# Patient Record
Sex: Male | Born: 1935 | Race: White | Hispanic: No | Marital: Married | Smoking: Never smoker
Health system: Southern US, Community
[De-identification: ages and names within clinical notes are randomized; demographics above are authoritative.]

## PROBLEM LIST (undated history)

## (undated) DIAGNOSIS — R233 Spontaneous ecchymoses: Secondary | ICD-10-CM

## (undated) DIAGNOSIS — G309 Alzheimer's disease, unspecified: Secondary | ICD-10-CM

## (undated) DIAGNOSIS — R972 Elevated prostate specific antigen [PSA]: Secondary | ICD-10-CM

## (undated) DIAGNOSIS — K4090 Unilateral inguinal hernia, without obstruction or gangrene, not specified as recurrent: Secondary | ICD-10-CM

## (undated) DIAGNOSIS — R310 Gross hematuria: Secondary | ICD-10-CM

## (undated) DIAGNOSIS — Z87442 Personal history of urinary calculi: Secondary | ICD-10-CM

## (undated) DIAGNOSIS — K921 Melena: Secondary | ICD-10-CM

## (undated) DIAGNOSIS — K219 Gastro-esophageal reflux disease without esophagitis: Secondary | ICD-10-CM

## (undated) DIAGNOSIS — R238 Other skin changes: Secondary | ICD-10-CM

## (undated) DIAGNOSIS — R0602 Shortness of breath: Secondary | ICD-10-CM

## (undated) DIAGNOSIS — R319 Hematuria, unspecified: Secondary | ICD-10-CM

## (undated) DIAGNOSIS — R109 Unspecified abdominal pain: Secondary | ICD-10-CM

## (undated) DIAGNOSIS — N179 Acute kidney failure, unspecified: Secondary | ICD-10-CM

## (undated) DIAGNOSIS — F0280 Dementia in other diseases classified elsewhere without behavioral disturbance: Secondary | ICD-10-CM

## (undated) DIAGNOSIS — N400 Enlarged prostate without lower urinary tract symptoms: Secondary | ICD-10-CM

## (undated) DIAGNOSIS — E785 Hyperlipidemia, unspecified: Secondary | ICD-10-CM

## (undated) DIAGNOSIS — K5900 Constipation, unspecified: Secondary | ICD-10-CM

## (undated) DIAGNOSIS — Z8719 Personal history of other diseases of the digestive system: Secondary | ICD-10-CM

## (undated) HISTORY — DX: Unspecified abdominal pain: R10.9

## (undated) HISTORY — DX: Other skin changes: R23.8

## (undated) HISTORY — DX: Hyperlipidemia, unspecified: E78.5

## (undated) HISTORY — DX: Personal history of other diseases of the digestive system: Z87.19

## (undated) HISTORY — DX: Constipation, unspecified: K59.00

## (undated) HISTORY — DX: Elevated prostate specific antigen (PSA): R97.20

## (undated) HISTORY — DX: Unilateral inguinal hernia, without obstruction or gangrene, not specified as recurrent: K40.90

## (undated) HISTORY — DX: Gastro-esophageal reflux disease without esophagitis: K21.9

## (undated) HISTORY — DX: Melena: K92.1

## (undated) HISTORY — DX: Personal history of urinary calculi: Z87.442

## (undated) HISTORY — DX: Benign prostatic hyperplasia without lower urinary tract symptoms: N40.0

## (undated) HISTORY — DX: Spontaneous ecchymoses: R23.3

## (undated) HISTORY — DX: Hematuria, unspecified: R31.9

## (undated) HISTORY — DX: Gross hematuria: R31.0

---

## 1999-09-15 ENCOUNTER — Encounter: Admission: RE | Admit: 1999-09-15 | Discharge: 1999-09-15 | Payer: Self-pay | Admitting: Urology

## 1999-09-15 ENCOUNTER — Encounter: Payer: Self-pay | Admitting: Urology

## 1999-09-17 ENCOUNTER — Ambulatory Visit (HOSPITAL_BASED_OUTPATIENT_CLINIC_OR_DEPARTMENT_OTHER): Admission: RE | Admit: 1999-09-17 | Discharge: 1999-09-17 | Payer: Self-pay | Admitting: Urology

## 2000-03-19 ENCOUNTER — Other Ambulatory Visit: Admission: RE | Admit: 2000-03-19 | Discharge: 2000-03-19 | Payer: Self-pay | Admitting: Urology

## 2002-07-28 ENCOUNTER — Emergency Department (HOSPITAL_COMMUNITY): Admission: EM | Admit: 2002-07-28 | Discharge: 2002-07-29 | Payer: Self-pay

## 2003-12-28 ENCOUNTER — Ambulatory Visit (HOSPITAL_COMMUNITY): Admission: RE | Admit: 2003-12-28 | Discharge: 2003-12-28 | Payer: Self-pay | Admitting: Gastroenterology

## 2004-07-01 HISTORY — PX: CHOLECYSTECTOMY: SHX55

## 2004-07-03 ENCOUNTER — Inpatient Hospital Stay (HOSPITAL_COMMUNITY): Admission: EM | Admit: 2004-07-03 | Discharge: 2004-07-06 | Payer: Self-pay | Admitting: Emergency Medicine

## 2011-11-16 ENCOUNTER — Ambulatory Visit
Admission: RE | Admit: 2011-11-16 | Discharge: 2011-11-16 | Disposition: A | Discharge status: Home / Self Care | Payer: Medicare Other | Source: Ambulatory Visit | Attending: Family Medicine | Admitting: Family Medicine

## 2011-11-16 ENCOUNTER — Other Ambulatory Visit: Payer: Self-pay | Admitting: Family Medicine

## 2011-11-16 DIAGNOSIS — R109 Unspecified abdominal pain: Secondary | ICD-10-CM

## 2011-12-29 ENCOUNTER — Encounter (HOSPITAL_COMMUNITY): Payer: Self-pay | Admitting: Emergency Medicine

## 2011-12-29 ENCOUNTER — Emergency Department (HOSPITAL_COMMUNITY): Payer: Medicare Other

## 2011-12-29 ENCOUNTER — Emergency Department (HOSPITAL_COMMUNITY)
Admission: EM | Admit: 2011-12-29 | Discharge: 2011-12-29 | Disposition: A | Discharge status: Home / Self Care | Payer: Medicare Other | Attending: Emergency Medicine | Admitting: Emergency Medicine

## 2011-12-29 DIAGNOSIS — Z7982 Long term (current) use of aspirin: Secondary | ICD-10-CM | POA: Insufficient documentation

## 2011-12-29 DIAGNOSIS — G309 Alzheimer's disease, unspecified: Secondary | ICD-10-CM | POA: Insufficient documentation

## 2011-12-29 DIAGNOSIS — K5900 Constipation, unspecified: Secondary | ICD-10-CM | POA: Insufficient documentation

## 2011-12-29 DIAGNOSIS — F0280 Dementia in other diseases classified elsewhere without behavioral disturbance: Secondary | ICD-10-CM | POA: Insufficient documentation

## 2011-12-29 DIAGNOSIS — R109 Unspecified abdominal pain: Secondary | ICD-10-CM | POA: Insufficient documentation

## 2011-12-29 HISTORY — DX: Dementia in other diseases classified elsewhere, unspecified severity, without behavioral disturbance, psychotic disturbance, mood disturbance, and anxiety: F02.80

## 2011-12-29 HISTORY — DX: Alzheimer's disease, unspecified: G30.9

## 2011-12-29 LAB — URINALYSIS, ROUTINE W REFLEX MICROSCOPIC
Nitrite: NEGATIVE
Protein, ur: NEGATIVE mg/dL
Specific Gravity, Urine: 1.015 (ref 1.005–1.030)
Urobilinogen, UA: 1 mg/dL (ref 0.0–1.0)

## 2011-12-29 NOTE — ED Notes (Signed)
Has been being treated by regular MD for blood in urine, has appt with urologist May 16th.

## 2011-12-29 NOTE — ED Notes (Signed)
To ED via [facility] with c/o R LQ pain, had episode last night with nausea and vomiting, went away, but returned this am, stated everything went black for a few seconds, and pain radiated from RLQ towards chest. Upon arrival denies pain, alert/oriented x 3, w/d.

## 2011-12-29 NOTE — ED Provider Notes (Signed)
History     CSN: [id number]  Arrival date & time 12/29/11  1110   First MD Initiated Contact with Patient 12/29/11 1131      Chief Complaint  Patient presents with  . Abdominal Pain    (Consider location/radiation/quality/duration/timing/severity/associated sxs/prior treatment) HPI Comments: Johnathan Browning is a 78 y.o. Male who had 30 minutes of severe right sided abdominal pain today at home. The pain resolved spontaneously. His wife gave him 325 mg of aspirin at the suggestion of the 911 operator. He was transported by EMS. He has been seeing his doctor for persistent hematuria for several months. He had a CT scan done 2 months ago that did not show any acute problem. The patient has been eating well. He has had frequent stooling for several months as and occasionally vomits if he strains to have a bowel movement. There's been no recent fever, chills, cough, vomiting, dysuria, no hematuria , or ambulation,difficulty.  Patient is a 78 y.o. male presenting with abdominal pain. The history is provided by the patient.  Abdominal Pain The primary symptoms of the illness include abdominal pain.    Past Medical History  Diagnosis Date  . Alzheimer's dementia     for 10 years    Past Surgical History  Procedure Date  . Cholecystectomy     History reviewed. No pertinent family history.  History  Substance Use Topics  . Smoking status: Not on file  . Smokeless tobacco: Not on file  . Alcohol Use: No      Review of Systems  Gastrointestinal: Positive for abdominal pain.  All other systems reviewed and are negative.    Allergies  Review of patient's allergies indicates no known allergies.  Home Medications   Current Outpatient Rx  Name Route Sig Dispense Refill  . ASPIRIN 325 MG PO TABS Oral Take 325 mg by mouth daily.    . ASPIRIN EC 81 MG PO TBEC Oral Take 81 mg by mouth daily.    Marland Kitchen FINASTERIDE 5 MG PO TABS Oral Take 5 mg by mouth daily.    Marland Kitchen GALANTAMINE  HYDROBROMIDE 8 MG PO TABS Oral Take 8 mg by mouth 2 (two) times daily.    . ADULT MULTIVITAMIN W/MINERALS CH Oral Take 1 tablet by mouth daily.    Marland Kitchen SIMVASTATIN 10 MG PO TABS Oral Take 10 mg by mouth at bedtime.      BP 136/76  Pulse 64  Temp(Src) 97.8 F (36.6 C) (Oral)  Resp 16  SpO2 99%  Physical Exam  Nursing note and vitals reviewed. Constitutional: He is oriented to person, place, and time. He appears well-developed and well-nourished.       He is a somewhat poor historian, but his wife answer many questions.  HENT:  Head: Normocephalic and atraumatic.  Right Ear: External ear normal.  Left Ear: External ear normal.  Eyes: Conjunctivae and EOM are normal. Pupils are equal, round, and reactive to light.  Neck: Normal range of motion and phonation normal. Neck supple.  Cardiovascular: Normal rate, regular rhythm, normal heart sounds and intact distal pulses.   Pulmonary/Chest: Effort normal and breath sounds normal. He exhibits no bony tenderness.  Abdominal: Soft. Normal appearance. There is no tenderness.  Musculoskeletal: Normal range of motion.  Neurological: He is alert and oriented to person, place, and time. He has normal strength. No cranial nerve deficit or sensory deficit. He exhibits normal muscle tone. Coordination normal.  Skin: Skin is warm, dry and intact.  Psychiatric: He  has a normal mood and affect. His behavior is normal.    ED Course  Procedures (including critical care time)   Labs Reviewed  URINALYSIS, ROUTINE W REFLEX MICROSCOPIC  URINE CULTURE   Dg Abd Acute W/chest  12/29/2011  *RADIOLOGY REPORT*  Clinical Data: Abdominal pain, dizziness.  ACUTE ABDOMEN SERIES (ABDOMEN 2 VIEW & CHEST 1 VIEW)  Comparison: CT 11/16/2011  Findings: Moderate stool burden throughout the colon.  Prior cholecystectomy. The bowel gas pattern is normal.  There is no evidence of free intraperitoneal air.  No suspicious radio-opaque calculi or other significant radiographic  abnormality is seen. Heart size and mediastinal contours are within normal limits.  Both lungs are clear.  IMPRESSION: Moderate stool burden.  No acute findings.  Original Report Authenticated By: Cyndie Chime, M.D.     1. Abdominal pain   2. Constipation       MDM  Nonspecific abdominal pain, transient, and not recurring after. Observation in the emergency department. Vital signs are normal. Patient is calm and comfortable. Has no complaints as of 1521 hours. Doubt colitis, urinary tract infection, metabolic instability or impending vascular collapse.   Plan: Home Medications- Miralax for constipation; Home Treatments- increase fluids; Recommended follow up- PCP 1 week    Flint Melter, MD 12/29/11 [phone]

## 2011-12-29 NOTE — Discharge Instructions (Signed)
Use MiraLAX twice a day until you have a good soft, bowel movement. Then, using MiraLAX once a day for one week.  Abdominal Pain Abdominal pain can be caused by many things. Your caregiver decides the seriousness of your pain by an examination and possibly blood tests and X-rays. Many cases can be observed and treated at home. Most abdominal pain is not caused by a disease and will probably improve without treatment. However, in many cases, more time must pass before a clear cause of the pain can be found. Before that point, it may not be known if you need more testing, or if hospitalization or surgery is needed. HOME CARE INSTRUCTIONS   Do not take laxatives unless directed by your caregiver.   Take pain medicine only as directed by your caregiver.   Only take over-the-counter or prescription medicines for pain, discomfort, or fever as directed by your caregiver.   Try a clear liquid diet (broth, tea, or water) for as long as directed by your caregiver. Slowly move to a bland diet as tolerated.  SEEK IMMEDIATE MEDICAL CARE IF:   The pain does not go away.   You have a fever.   You keep throwing up (vomiting).   The pain is felt only in portions of the abdomen. Pain in the right side could possibly be appendicitis. In an adult, pain in the left lower portion of the abdomen could be colitis or diverticulitis.   You pass bloody or black tarry stools.  MAKE SURE YOU:   Understand these instructions.   Will watch your condition.   Will get help right away if you are not doing well or get worse.  Document Released: 05/27/2005 Document Revised: 08/06/2011 Document Reviewed: 04/04/2008 [facility] Patient Information 2012 [place], [place].Constipation in Adults Constipation is having fewer than 2 bowel movements per week. Usually, the stools are hard. As we grow older, constipation is more common. If you try to fix constipation with laxatives, the problem may get worse. This is because  laxatives taken over a long period of time make the colon muscles weaker. A low-fiber diet, not taking in enough fluids, and taking some medicines may make these problems worse. MEDICATIONS THAT MAY CAUSE CONSTIPATION  Water pills (diuretics).   Calcium channel blockers (used to control blood pressure and for the heart).   Certain pain medicines (narcotics).   Anticholinergics.   Anti-inflammatory agents.   Antacids that contain aluminum.  DISEASES THAT CONTRIBUTE TO CONSTIPATION  Diabetes.   Parkinson's disease.   Dementia.   Stroke.   Depression.   Illnesses that cause problems with salt and water metabolism.  HOME CARE INSTRUCTIONS   Constipation is usually best cared for without medicines. Increasing dietary fiber and eating more fruits and vegetables is the best way to manage constipation.   Slowly increase fiber intake to 25 to 38 grams per day. Whole grains, fruits, vegetables, and legumes are good sources of fiber. A dietitian can further help you incorporate high-fiber foods into your diet.   Drink enough water and fluids to keep your urine clear or pale yellow.   A fiber supplement may be added to your diet if you cannot get enough fiber from foods.   Increasing your activities also helps improve regularity.   Suppositories, as suggested by your caregiver, will also help. If you are using antacids, such as aluminum or calcium containing products, it will be helpful to switch to products containing magnesium if your caregiver says it is okay.  If you have been given a liquid injection (enema) today, this is only a temporary measure. It should not be relied on for treatment of longstanding (chronic) constipation.   Stronger measures, such as magnesium sulfate, should be avoided if possible. This may cause uncontrollable diarrhea. Using magnesium sulfate may not allow you time to make it to the bathroom.  SEEK IMMEDIATE MEDICAL CARE IF:   There is bright red  blood in the stool.   The constipation stays for more than 4 days.   There is belly (abdominal) or rectal pain.   You do not seem to be getting better.   You have any questions or concerns.  MAKE SURE YOU:   Understand these instructions.   Will watch your condition.   Will get help right away if you are not doing well or get worse.  Document Released: 05/15/2004 Document Revised: 08/06/2011 Document Reviewed: 07/21/2011 [facility] Patient Information 2012 [place], [place].

## 2011-12-29 NOTE — ED Notes (Signed)
[id number]<AV> Expected date:12/29/11<BR> Expected time:11:02 AM<BR> Means of arrival:Ambulance<BR> Comments:<BR> RLQ pain-radiating to chest.

## 2011-12-30 LAB — URINE CULTURE

## 2012-02-10 ENCOUNTER — Encounter (INDEPENDENT_AMBULATORY_CARE_PROVIDER_SITE_OTHER): Payer: Self-pay

## 2012-02-15 ENCOUNTER — Ambulatory Visit (INDEPENDENT_AMBULATORY_CARE_PROVIDER_SITE_OTHER): Payer: Medicare Other | Admitting: General Surgery

## 2012-02-15 ENCOUNTER — Encounter (INDEPENDENT_AMBULATORY_CARE_PROVIDER_SITE_OTHER): Payer: Self-pay | Admitting: General Surgery

## 2012-02-15 VITALS — BP 132/70 | HR 68 | Temp 97.4°F | Resp 14 | Ht 69.0 in | Wt 156.4 lb

## 2012-02-15 DIAGNOSIS — K4090 Unilateral inguinal hernia, without obstruction or gangrene, not specified as recurrent: Secondary | ICD-10-CM

## 2012-02-15 NOTE — Patient Instructions (Signed)
Avoid a lot of heavy lifting and straining.  Take Miralax daily.

## 2012-02-15 NOTE — Progress Notes (Signed)
Patient ID: Johnathan Browning, male   DOB: 1931/06/16, 78 y.o.   MRN: [id number]  Chief Complaint  Patient presents with  . Inguinal Hernia    HPI Johnathan Browning is a 78 y.o. male.   HPI He is referred by Dr. Margarita Grizzle for evaluation of a symptomatic right inguinal hernia. He began having some discomfort and swelling in the right inguinal area months ago. It tends to be worse when he is straining. He saw Dr. Margarita Grizzle for evaluation of gross hematuria and was noted to have a hernia. He's been referred here for further evaluation and treatment. He does have some constipation at times. He states he does not have to strain much to urinate. He is here with his wife and his daughter. He no longer drives. Past Medical History  Diagnosis Date  . Alzheimer's dementia     for 10 years  . Right inguinal hernia   . Gross hematuria   . Elevated PSA 2001; 2003    bx negative  . History of nephrolithiasis   . History of esophageal reflux   . Enlarged prostate   . Hyperlipidemia   . GERD (gastroesophageal reflux disease)   . Blood in urine   . Abdominal pain   . Constipation   . Easy bruising   . Blood in stool   . Inguinal hernia     Past Surgical History  Procedure Date  . Cholecystectomy 07/2004    Family History  Problem Relation Age of Onset  . Cancer Mother     breast  . Heart disease Mother   . Stroke Father     Social History History  Substance Use Topics  . Smoking status: Never Smoker   . Smokeless tobacco: Never Used  . Alcohol Use: No    No Known Allergies  Current Outpatient Prescriptions  Medication Sig Dispense Refill  . aspirin EC 81 MG tablet Take 81 mg by mouth daily.      . finasteride (PROSCAR) 5 MG tablet Take 5 mg by mouth daily.      Marland Kitchen galantamine (RAZADYNE) 8 MG tablet Take 8 mg by mouth 2 (two) times daily.      . Multiple Vitamin (MULITIVITAMIN WITH MINERALS) TABS Take 1 tablet by mouth daily.      . simvastatin (ZOCOR) 10 MG tablet Take 10 mg  by mouth at bedtime.        Review of Systems Review of Systems  Constitutional: Negative.   Respiratory: Negative.   Cardiovascular: Negative.   Gastrointestinal: Positive for abdominal pain and constipation. Blood in stool: right groin.  Genitourinary: Positive for difficulty urinating (occasional).  Hematological: Negative.     Blood pressure 132/70, pulse 68, temperature 97.4 F (36.3 C), temperature source Temporal, resp. rate 14, height 5\' 9"  (1.753 m), weight 156 lb 6 oz (70.931 kg).  Physical Exam Physical Exam  Constitutional: He appears well-developed and well-nourished. No distress.  HENT:  Head: Normocephalic and atraumatic.  Neck: Neck supple.  Cardiovascular: Normal rate and regular rhythm.   Pulmonary/Chest: Effort normal and breath sounds normal.  Abdominal: Soft. He exhibits no distension and no mass. There is no tenderness.       Small, reducible umbilical hernia.  Small upper abdominal scars.  Genitourinary:       Reducible right inguinal bulge.  No left inguinal bulge.  Musculoskeletal: He exhibits no edema.  Skin: Skin is warm and dry.    Data Reviewed Dr. Hilario Quarry notes.  Assessment  Symptomatic right inguinal hernia.    Plan    Right inguinal hernia repair with mesh. We will try this as an outpatient but he may be an overnight stay.  We'll have him start MiraLax daily to help with his constipation.  I have explained the procedure, risks, and aftercare of inguinal hernia repair.  Risks include but are not limited to bleeding, infection, wound problems, anesthesia, recurrence, bladder or intestine injury, urinary retention, testicular dysfunction, chronic pain, mesh problems.  They seem to understand and agree to proceed.         Laniesha Das J 02/15/2012, 2:16 PM

## 2012-03-04 ENCOUNTER — Encounter (HOSPITAL_COMMUNITY): Payer: Self-pay | Admitting: Pharmacy Technician

## 2012-03-07 ENCOUNTER — Other Ambulatory Visit (INDEPENDENT_AMBULATORY_CARE_PROVIDER_SITE_OTHER): Payer: Self-pay | Admitting: General Surgery

## 2012-03-07 NOTE — Progress Notes (Signed)
03/07/12 Need orders placed in EPIC for upcoming upcoming surgery on 03/17/12.  Preop appointment is 03/11/12 at 1300.   Thanks.

## 2012-03-11 ENCOUNTER — Encounter (HOSPITAL_COMMUNITY)
Admission: RE | Admit: 2012-03-11 | Discharge: 2012-03-11 | Disposition: A | Discharge status: Home / Self Care | Payer: Medicare Other | Source: Ambulatory Visit | Attending: General Surgery | Admitting: General Surgery

## 2012-03-11 ENCOUNTER — Encounter (HOSPITAL_COMMUNITY): Payer: Self-pay

## 2012-03-11 DIAGNOSIS — R0602 Shortness of breath: Secondary | ICD-10-CM

## 2012-03-11 HISTORY — DX: Shortness of breath: R06.02

## 2012-03-11 LAB — CBC
MCH: 32.9 pg (ref 26.0–34.0)
MCHC: 34.4 g/dL (ref 30.0–36.0)
MCV: 95.8 fL (ref 78.0–100.0)
Platelets: 146 10*3/uL — ABNORMAL LOW (ref 150–400)
RBC: 5.01 MIL/uL (ref 4.22–5.81)
RDW: 12.9 % (ref 11.5–15.5)

## 2012-03-11 LAB — COMPREHENSIVE METABOLIC PANEL
ALT: 16 U/L (ref 0–53)
AST: 23 U/L (ref 0–37)
Albumin: 3.7 g/dL (ref 3.5–5.2)
CO2: 29 mEq/L (ref 19–32)
Calcium: 9.4 mg/dL (ref 8.4–10.5)
Creatinine, Ser: 0.83 mg/dL (ref 0.50–1.35)
GFR calc non Af Amer: 82 mL/min — ABNORMAL LOW (ref >90)
Sodium: 141 mEq/L (ref 135–145)
Total Protein: 6.8 g/dL (ref 6.0–8.3)

## 2012-03-11 LAB — DIFFERENTIAL
Basophils Absolute: 0 10*3/uL (ref 0.0–0.1)
Eosinophils Relative: 3 % (ref 0–5)
Lymphocytes Relative: 22 % (ref 12–46)
Lymphs Abs: 1.6 10*3/uL (ref 0.7–4.0)
Monocytes Absolute: 0.6 10*3/uL (ref 0.1–1.0)
Neutro Abs: 4.8 10*3/uL (ref 1.7–7.7)

## 2012-03-11 LAB — SURGICAL PCR SCREEN: MRSA, PCR: NEGATIVE

## 2012-03-11 NOTE — Pre-Procedure Instructions (Addendum)
03-11-12 No EKG/CXR needed per anesthesia guidelines. 03-11-12 1640 Spoke with wife per phone. Inform positive screen for Staph aureus-will fill RX for Mupirocin Oint. And use as directed.W.Lashonne Shull,RN

## 2012-03-11 NOTE — Patient Instructions (Addendum)
20 TRAVARUS TRUDO  03/11/2012   Your procedure is scheduled on: 7-18  -2013  Report to [facility] at     1030   AM .  Call this number if you have problems the morning of surgery: [phone]   Remember:   Do not eat food:After Midnight.  May have clear liquids:up to 6 Hours before arrival. Nothing after : 0700 AM  Clear liquids include soda, tea, black coffee, apple or grape juice, broth.  Take these medicines the morning of surgery with A SIP OF WATER: Finasteride, Galantamine.   Do not wear jewelry, make-up or nail polish.  Do not wear lotions, powders, or perfumes. You may wear deodorant.  Do not shave 48 hours prior to surgery.(face and neck okay, no shaving of legs)  Do not bring valuables to the hospital.  Contacts, dentures or bridgework may not be worn into surgery.  Leave suitcase in the car. After surgery it may be brought to your room.  For patients admitted to the hospital, checkout time is 11:00 AM the day of discharge.   Patients discharged the day of surgery will not be allowed to drive home.  Name and phone number of your driver: family  Special Instructions: CHG Shower Use Special Wash: 1/2 bottle night before surgery and 1/2 bottle morning of surgery.(avoid face and genitals)   Please read over the following fact sheets that you were given: MRSA Information.

## 2012-03-17 ENCOUNTER — Encounter (HOSPITAL_COMMUNITY): Payer: Self-pay | Admitting: *Deleted

## 2012-03-17 ENCOUNTER — Encounter (HOSPITAL_COMMUNITY)
Admission: RE | Disposition: A | Discharge status: Home / Self Care | Payer: Self-pay | Source: Ambulatory Visit | Attending: General Surgery

## 2012-03-17 ENCOUNTER — Ambulatory Visit (HOSPITAL_COMMUNITY)
Admission: RE | Admit: 2012-03-17 | Discharge: 2012-03-17 | Disposition: A | Discharge status: Home / Self Care | Payer: Medicare Other | Source: Ambulatory Visit | Attending: General Surgery | Admitting: General Surgery

## 2012-03-17 ENCOUNTER — Ambulatory Visit (HOSPITAL_COMMUNITY): Payer: Medicare Other | Admitting: Anesthesiology

## 2012-03-17 ENCOUNTER — Encounter (HOSPITAL_COMMUNITY): Payer: Self-pay | Admitting: Anesthesiology

## 2012-03-17 DIAGNOSIS — Z01812 Encounter for preprocedural laboratory examination: Secondary | ICD-10-CM | POA: Insufficient documentation

## 2012-03-17 DIAGNOSIS — Z7982 Long term (current) use of aspirin: Secondary | ICD-10-CM | POA: Insufficient documentation

## 2012-03-17 DIAGNOSIS — Z79899 Other long term (current) drug therapy: Secondary | ICD-10-CM | POA: Insufficient documentation

## 2012-03-17 DIAGNOSIS — G309 Alzheimer's disease, unspecified: Secondary | ICD-10-CM | POA: Insufficient documentation

## 2012-03-17 DIAGNOSIS — K219 Gastro-esophageal reflux disease without esophagitis: Secondary | ICD-10-CM | POA: Insufficient documentation

## 2012-03-17 DIAGNOSIS — E785 Hyperlipidemia, unspecified: Secondary | ICD-10-CM | POA: Insufficient documentation

## 2012-03-17 DIAGNOSIS — F0280 Dementia in other diseases classified elsewhere without behavioral disturbance: Secondary | ICD-10-CM | POA: Insufficient documentation

## 2012-03-17 DIAGNOSIS — K4090 Unilateral inguinal hernia, without obstruction or gangrene, not specified as recurrent: Secondary | ICD-10-CM

## 2012-03-17 HISTORY — PX: HERNIA REPAIR: SHX51

## 2012-03-17 HISTORY — PX: INGUINAL HERNIA REPAIR: SHX194

## 2012-03-17 SURGERY — REPAIR, HERNIA, INGUINAL, ADULT
Anesthesia: General | Site: Groin | Laterality: Right | Wound class: Clean Contaminated

## 2012-03-17 MED ORDER — CEFAZOLIN SODIUM-DEXTROSE 2-3 GM-% IV SOLR
INTRAVENOUS | Status: AC
  Filled 2012-03-17: qty 50

## 2012-03-17 MED ORDER — KETOROLAC TROMETHAMINE 30 MG/ML IJ SOLN
15.0000 mg | Freq: Once | INTRAMUSCULAR | Status: AC | PRN
  Administered 2012-03-17: 15 mg via INTRAVENOUS

## 2012-03-17 MED ORDER — OXYCODONE HCL 5 MG PO TABS
ORAL_TABLET | ORAL | Status: AC
  Filled 2012-03-17: qty 1

## 2012-03-17 MED ORDER — FENTANYL CITRATE 0.05 MG/ML IJ SOLN: 25.0000 ug | INTRAMUSCULAR | Status: DC | PRN

## 2012-03-17 MED ORDER — OXYCODONE-ACETAMINOPHEN 5-325 MG PO TABS: 1.0000 | ORAL_TABLET | ORAL | Status: AC | PRN

## 2012-03-17 MED ORDER — EPHEDRINE SULFATE 50 MG/ML IJ SOLN
INTRAMUSCULAR | Status: DC | PRN
  Administered 2012-03-17 (×2): 5 mg via INTRAVENOUS

## 2012-03-17 MED ORDER — ACETAMINOPHEN 10 MG/ML IV SOLN
INTRAVENOUS | Status: AC
  Filled 2012-03-17: qty 100

## 2012-03-17 MED ORDER — BUPIVACAINE-EPINEPHRINE 0.5% -1:200000 IJ SOLN
INTRAMUSCULAR | Status: DC | PRN
  Administered 2012-03-17: 30 mL

## 2012-03-17 MED ORDER — ONDANSETRON HCL 4 MG/2ML IJ SOLN
INTRAMUSCULAR | Status: DC | PRN
  Administered 2012-03-17: 4 mg via INTRAVENOUS

## 2012-03-17 MED ORDER — CEFAZOLIN SODIUM-DEXTROSE 2-3 GM-% IV SOLR: 2.0000 g | INTRAVENOUS | Status: DC

## 2012-03-17 MED ORDER — KETOROLAC TROMETHAMINE 30 MG/ML IJ SOLN
INTRAMUSCULAR | Status: AC
  Filled 2012-03-17: qty 1

## 2012-03-17 MED ORDER — LACTATED RINGERS IV SOLN
INTRAVENOUS | Status: DC
  Administered 2012-03-17: 1000 mL via INTRAVENOUS
  Administered 2012-03-17: 15:00:00 via INTRAVENOUS

## 2012-03-17 MED ORDER — ACETAMINOPHEN 10 MG/ML IV SOLN
INTRAVENOUS | Status: DC | PRN
  Administered 2012-03-17: 1000 mg via INTRAVENOUS

## 2012-03-17 MED ORDER — FENTANYL CITRATE 0.05 MG/ML IJ SOLN
INTRAMUSCULAR | Status: DC | PRN
  Administered 2012-03-17: 100 ug via INTRAVENOUS
  Administered 2012-03-17 (×2): 25 ug via INTRAVENOUS

## 2012-03-17 MED ORDER — PROMETHAZINE HCL 25 MG/ML IJ SOLN: 6.2500 mg | INTRAMUSCULAR | Status: DC | PRN

## 2012-03-17 MED ORDER — BUPIVACAINE 0.5 % ON-Q PUMP SINGLE CATH 100 ML
100.0000 mL | INJECTION | Status: DC
  Administered 2012-03-17: 100 mL
  Filled 2012-03-17 (×2): qty 100

## 2012-03-17 MED ORDER — VANCOMYCIN HCL IN DEXTROSE 1-5 GM/200ML-% IV SOLN
1000.0000 mg | Freq: Once | INTRAVENOUS | Status: AC
  Administered 2012-03-17: 1000 mg via INTRAVENOUS

## 2012-03-17 MED ORDER — PROPOFOL 10 MG/ML IV BOLUS
INTRAVENOUS | Status: DC | PRN
  Administered 2012-03-17: 200 mg via INTRAVENOUS

## 2012-03-17 MED ORDER — VANCOMYCIN HCL IN DEXTROSE 1-5 GM/200ML-% IV SOLN
INTRAVENOUS | Status: AC
  Filled 2012-03-17: qty 200

## 2012-03-17 MED ORDER — OXYCODONE HCL 5 MG PO TABS
5.0000 mg | ORAL_TABLET | ORAL | Status: DC | PRN
  Administered 2012-03-17: 5 mg via ORAL

## 2012-03-17 MED ORDER — KETOROLAC TROMETHAMINE 15 MG/ML IJ SOLN
INTRAMUSCULAR | Status: AC
  Filled 2012-03-17: qty 1

## 2012-03-17 MED ORDER — LIDOCAINE HCL (CARDIAC) 20 MG/ML IV SOLN
INTRAVENOUS | Status: DC | PRN
  Administered 2012-03-17: 75 mg via INTRAVENOUS

## 2012-03-17 SURGICAL SUPPLY — 45 items
APL SKNCLS STERI-STRIP NONHPOA (GAUZE/BANDAGES/DRESSINGS) ×1
BENZOIN TINCTURE PRP APPL 2/3 (GAUZE/BANDAGES/DRESSINGS) ×2 IMPLANT
BLADE HEX COATED 2.75 (ELECTRODE) ×2 IMPLANT
BLADE SURG 15 STRL LF DISP TIS (BLADE) ×1 IMPLANT
BLADE SURG 15 STRL SS (BLADE) ×2
BLADE SURG SZ10 CARB STEEL (BLADE) ×2 IMPLANT
CANISTER SUCTION 2500CC (MISCELLANEOUS) ×2 IMPLANT
CATH KIT ON Q 2.5IN SLV (PAIN MANAGEMENT) ×1 IMPLANT
CLOTH BEACON ORANGE TIMEOUT ST (SAFETY) ×2 IMPLANT
CLSR STERI-STRIP ANTIMIC 1/2X4 (GAUZE/BANDAGES/DRESSINGS) ×1 IMPLANT
DECANTER SPIKE VIAL GLASS SM (MISCELLANEOUS) ×2 IMPLANT
DRAIN PENROSE 18X1/2 LTX STRL (DRAIN) ×1 IMPLANT
DRAPE INCISE IOBAN 66X45 STRL (DRAPES) ×2 IMPLANT
DRAPE LAPAROTOMY TRNSV 102X78 (DRAPE) ×2 IMPLANT
DRAPE UTILITY XL STRL (DRAPES) ×2 IMPLANT
DRESSING TELFA 8X3 (GAUZE/BANDAGES/DRESSINGS) IMPLANT
DRSG TEGADERM 2-3/8X2-3/4 SM (GAUZE/BANDAGES/DRESSINGS) IMPLANT
DRSG TEGADERM 4X4.75 (GAUZE/BANDAGES/DRESSINGS) ×2 IMPLANT
ELECT REM PT RETURN 9FT ADLT (ELECTROSURGICAL) ×2
ELECTRODE REM PT RTRN 9FT ADLT (ELECTROSURGICAL) ×1 IMPLANT
GLOVE ECLIPSE 8.0 STRL XLNG CF (GLOVE) ×2 IMPLANT
GLOVE INDICATOR 8.0 STRL GRN (GLOVE) ×4 IMPLANT
GOWN STRL NON-REIN LRG LVL3 (GOWN DISPOSABLE) ×2 IMPLANT
GOWN STRL REIN XL XLG (GOWN DISPOSABLE) ×5 IMPLANT
KIT BASIN OR (CUSTOM PROCEDURE TRAY) ×2 IMPLANT
MESH HERNIA 3X6 (Mesh General) ×1 IMPLANT
NDL HYPO 25X1 1.5 SAFETY (NEEDLE) ×1 IMPLANT
NEEDLE HYPO 25X1 1.5 SAFETY (NEEDLE) ×2 IMPLANT
NS IRRIG 1000ML POUR BTL (IV SOLUTION) ×2 IMPLANT
PACK BASIC VI WITH GOWN DISP (CUSTOM PROCEDURE TRAY) ×2 IMPLANT
PENCIL BUTTON HOLSTER BLD 10FT (ELECTRODE) ×2 IMPLANT
SPONGE GAUZE 4X4 12PLY (GAUZE/BANDAGES/DRESSINGS) ×2 IMPLANT
SPONGE LAP 4X18 X RAY DECT (DISPOSABLE) ×2 IMPLANT
STRIP CLOSURE SKIN 1/2X4 (GAUZE/BANDAGES/DRESSINGS) ×2 IMPLANT
SUT MNCRL AB 4-0 PS2 18 (SUTURE) ×2 IMPLANT
SUT PROLENE 2 0 CT2 30 (SUTURE) ×4 IMPLANT
SUT VIC AB 2-0 SH 18 (SUTURE) ×2 IMPLANT
SUT VIC AB 3-0 54XBRD REEL (SUTURE) ×1 IMPLANT
SUT VIC AB 3-0 BRD 54 (SUTURE) ×2
SUT VIC AB 3-0 SH 27 (SUTURE) ×4
SUT VIC AB 3-0 SH 27XBRD (SUTURE) ×2 IMPLANT
SYR BULB IRRIGATION 50ML (SYRINGE) ×2 IMPLANT
SYR CONTROL 10ML LL (SYRINGE) ×2 IMPLANT
TOWEL OR 17X26 10 PK STRL BLUE (TOWEL DISPOSABLE) ×2 IMPLANT
YANKAUER SUCT BULB TIP 10FT TU (MISCELLANEOUS) ×2 IMPLANT

## 2012-03-17 NOTE — Anesthesia Preprocedure Evaluation (Addendum)
Anesthesia Evaluation  Patient identified by MRN, date of birth, ID band Patient awake    Reviewed: Allergy & Precautions, H&P , NPO status , Patient's Chart, lab work & pertinent test results  Airway Mallampati: II TM Distance: <3 FB Neck ROM: Full    Dental No notable dental hx.    Pulmonary neg pulmonary ROS,  breath sounds clear to auscultation  Pulmonary exam normal       Cardiovascular negative cardio ROS  Rhythm:Regular Rate:Normal     Neuro/Psych dementia negative psych ROS   GI/Hepatic negative GI ROS, Neg liver ROS,   Endo/Other  negative endocrine ROS  Renal/GU negative Renal ROS  negative genitourinary   Musculoskeletal negative musculoskeletal ROS (+)   Abdominal   Peds negative pediatric ROS (+)  Hematology negative hematology ROS (+)   Anesthesia Other Findings   Reproductive/Obstetrics negative OB ROS                           Anesthesia Physical Anesthesia Plan  ASA: I  Anesthesia Plan: General   Post-op Pain Management:    Induction: Intravenous  Airway Management Planned: LMA and Oral ETT  Additional Equipment:   Intra-op Plan:   Post-operative Plan: Extubation in OR  Informed Consent: I have reviewed the patients History and Physical, chart, labs and discussed the procedure including the risks, benefits and alternatives for the proposed anesthesia with the patient or authorized representative who has indicated his/her understanding and acceptance.   Dental advisory given  Plan Discussed with: CRNA and Surgeon  Anesthesia Plan Comments:         Anesthesia Quick Evaluation

## 2012-03-17 NOTE — H&P (Signed)
Johnathan Browning is an 78 y.o. male.   Chief Complaint: Here for elective surgery HPI: He has a symptomatic right inguinal hernia and presents for repair.  Past Medical History  Diagnosis Date  . Alzheimer's dementia     for 10 years  . Right inguinal hernia   . Gross hematuria   . Elevated PSA 2001; 2003    bx negative  . History of nephrolithiasis   . History of esophageal reflux   . Enlarged prostate   . Hyperlipidemia   . GERD (gastroesophageal reflux disease)   . Blood in urine   . Abdominal pain   . Constipation   . Easy bruising   . Blood in stool   . Inguinal hernia   . Shortness of breath 03-11-12    with exertion    Past Surgical History  Procedure Date  . Cholecystectomy 07/2004    Family History  Problem Relation Age of Onset  . Cancer Mother     breast  . Heart disease Mother   . Stroke Father    Social History:  reports that he has never smoked. He has never used smokeless tobacco. He reports that he does not drink alcohol or use illicit drugs.  Allergies: No Known Allergies  Medications Prior to Admission  Medication Sig Dispense Refill  . finasteride (PROSCAR) 5 MG tablet Take 5 mg by mouth daily with breakfast.       . galantamine (RAZADYNE) 8 MG tablet Take 8 mg by mouth 2 (two) times daily.      . Multiple Vitamin (MULITIVITAMIN WITH MINERALS) TABS Take 1 tablet by mouth daily.      . polyethylene glycol (MIRALAX / GLYCOLAX) packet Take 17 g by mouth daily with breakfast.      . simvastatin (ZOCOR) 10 MG tablet Take 10 mg by mouth at bedtime.      Marland Kitchen aspirin EC 81 MG tablet Take 81 mg by mouth daily with breakfast.       . naproxen sodium (ANAPROX) 220 MG tablet Take 220 mg by mouth daily as needed. For pain        No results found for this or any previous visit (from the past 48 hour(s)). No results found.  Review of Systems  Constitutional: Negative for fever and chills.  HENT: Negative for sore throat.   Respiratory: Negative for  cough.   Gastrointestinal: Negative for nausea, vomiting and diarrhea.    Blood pressure 146/64, pulse 65, temperature 98 F (36.7 C), temperature source Oral, resp. rate 18, SpO2 100.00%. Physical Exam  Constitutional: He appears well-developed and well-nourished. No distress.  HENT:  Head: Normocephalic and atraumatic.  Cardiovascular: Normal rate and regular rhythm.   Respiratory: Effort normal and breath sounds normal.  GI: Soft. He exhibits no distension. There is no tenderness.       Very small umbilical hernia.  Genitourinary:       Reducible right inguinal bulge.  No left inguinal bulge.  Musculoskeletal: He exhibits no edema.  Lymphadenopathy:    He has no cervical adenopathy.  Skin: Skin is warm and dry.     Assessment/Plan Right Inguinal Hernia  Plan:  Right inguinal hernia repair and OnQ pump placement.  Richardo Popoff J 03/17/2012, 12:47 PM

## 2012-03-17 NOTE — Interval H&P Note (Signed)
History and Physical Interval Note:  03/17/2012 12:50 PM  Johnathan Browning  has presented today for surgery, with the diagnosis of right inguinal hernia  The various methods of treatment have been discussed with the patient and family. After consideration of risks, benefits and other options for treatment, the patient has consented to  Procedure(s) (LRB): HERNIA REPAIR INGUINAL ADULT (Right) INSERTION OF MESH (Right) as a surgical intervention .  The patient's history has been reviewed, patient examined, no change in status, stable for surgery.  I have reviewed the patients' chart and labs.  Questions were answered to the patient's satisfaction.     Ioane Bhola [facility]

## 2012-03-17 NOTE — Anesthesia Postprocedure Evaluation (Signed)
  Anesthesia Post-op Note  Patient: Johnathan Browning  Procedure(s) Performed: Procedure(s) (LRB): HERNIA REPAIR INGUINAL ADULT (Right) INSERTION OF MESH (Right)  Patient Location: PACU  Anesthesia Type: General  Level of Consciousness: awake and alert   Airway and Oxygen Therapy: Patient Spontanous Breathing  Post-op Pain: mild  Post-op Assessment: Post-op Vital signs reviewed, Patient's Cardiovascular Status Stable, Respiratory Function Stable, Patent Airway and No signs of Nausea or vomiting  Post-op Vital Signs: stable  Complications: No apparent anesthesia complications

## 2012-03-17 NOTE — Transfer of Care (Signed)
Immediate Anesthesia Transfer of Care Note  Patient: Johnathan Browning  Procedure(s) Performed: Procedure(s) (LRB): HERNIA REPAIR INGUINAL ADULT (Right) INSERTION OF MESH (Right)  Patient Location: PACU  Anesthesia Type: General  Level of Consciousness: sedated, patient cooperative and responds to stimulaton  Airway & Oxygen Therapy: Patient Spontanous Breathing and Patient connected to face mask oxgen  Post-op Assessment: Report given to PACU RN and Post -op Vital signs reviewed and stable  Post vital signs: Reviewed and stable  Complications: No apparent anesthesia complications

## 2012-03-17 NOTE — Op Note (Signed)
Preoperative diagnosis:  Right inguinal hernia.  Postoperative diagnosis:  Same  Procedure:  Right inguinal hernia repair with mesh and placement of OnQ pump  Surgeon:  Avel Peace, M.D.  Anesthesia:  General/LMA with local (Marcaine).  Indication:  This is a 78 year old male with a symptomatic right inguinal hernia that presents for repair.  The procedure, risks, and aftercare were discussed with him preoperatively.  Technique:  The patient was seen in the holding room and the right groin was marked with my initials. The patient was brought to the operating, placed supine on the operating table, and the anesthetic was administered by the anesthesiologist. The hair in the right groin area was clipped as was felt to be necessary. This area was then sterilely prepped and draped.  Local anesthetic was infiltrated in the superficial and deep tissues in the right groin.  An incision was made through the skin and subcutaneous tissue until the external oblique aponeurosis was identified.  Local anesthetic was infiltrated deep to the external oblique aponeurosis. The external oblique aponeurosis was divided through the external ring medially and back toward the anterior superior iliac spine laterally. Using blunt dissection, the shelving edge of the inguinal ligament was identified inferiorly and the internal oblique aponeurosis and muscle were identified superiorly. The ilioinguinal nerve was identified and preserved.  The spermatic cord was isolated and a posterior window was made around it. An indirect hernia sac was identified and separated from the spermatic cord using blunt dissection. The hernia sac and its contents were reduced through the indirect hernia defect.   A piece of 3" x 6" polypropylene mesh was brought into the field and anchored 1-2 cm medial to the pubic tubercle with 2-0 Prolene suture. The inferior aspect of the mesh was anchored to the shelving edge of the inguinal ligament  with running 2-0 Prolene suture to a level 1-2 cm lateral to the internal ring. A slit was cut in the mesh creating 2 tails. These were wrapped around the spermatic cord. The superior aspect of the mesh was anchored to the internal oblique aponeurosis and muscle with interrupted 2-0 Vicryl sutures. The 2 tails of the mesh were then crossed creating a new internal ring and were anchored to the shelving edge of the inguinal ligament with 2-0 Prolene suture. The tip of a hemostat could be placed through the new aperture. The lateral aspect of the mesh was then tucked deep to the external oblique aponeurosis.  The wound was inspected and hemostasis was adequate.  The OnQ pump catheter was was placed through a small incision lateral to the right groin incision.  It was positioned anterior to the mesh. The external oblique aponeurosis was then closed over the mesh and cord with running 3-0 Vicryl suture. The subcutaneous tissue was closed with running 3-0 Vicryl suture. The skin closed with a running 4-0 Monocryl subcuticular stitch.  Steri-Strips and a sterile dressing were applied to the incision.  Steri strips and a sterile dressing were applied to the OnQ catheter as well and it was hooked up to the reservoir.  The procedure was well-tolerated without any apparent complications and the patient was taken to the recovery room in satisfactory condition.

## 2012-03-18 ENCOUNTER — Encounter (HOSPITAL_COMMUNITY): Payer: Self-pay | Admitting: General Surgery

## 2012-03-21 ENCOUNTER — Encounter (HOSPITAL_COMMUNITY): Admission: RE | Payer: Self-pay | Source: Ambulatory Visit

## 2012-03-21 ENCOUNTER — Ambulatory Visit (HOSPITAL_COMMUNITY): Admission: RE | Admit: 2012-03-21 | Payer: Medicare Other | Source: Ambulatory Visit | Admitting: General Surgery

## 2012-03-21 SURGERY — REPAIR, HERNIA, INGUINAL, ADULT
Anesthesia: Choice | Laterality: Right

## 2012-04-06 ENCOUNTER — Encounter (INDEPENDENT_AMBULATORY_CARE_PROVIDER_SITE_OTHER): Payer: Self-pay | Admitting: General Surgery

## 2012-04-06 ENCOUNTER — Ambulatory Visit (INDEPENDENT_AMBULATORY_CARE_PROVIDER_SITE_OTHER): Payer: Medicare Other | Admitting: General Surgery

## 2012-04-06 VITALS — BP 132/7 | HR 84 | Temp 97.8°F | Ht 68.0 in | Wt 154.8 lb

## 2012-04-06 DIAGNOSIS — Z9889 Other specified postprocedural states: Secondary | ICD-10-CM

## 2012-04-06 NOTE — Progress Notes (Signed)
Operation:  Right inguinal hernia repair with mesh  Date:  March 21, 2012  HPI:  He is here for his first postoperative visit and is doing well.   Physical Exam: Right groin incision is clean and intact with minimal swelling. Hernia repair is solid.  Assessment:  Doing well after open right inguinal hernia repair  Plan:  Continue light activities for 4 more weeks and activities as tolerated and we discussed what that meant. Return visit p.r.n.

## 2012-04-06 NOTE — Patient Instructions (Signed)
Continue light activities.  May resume normal activities as tolerated after Labor Day.

## 2012-08-08 ENCOUNTER — Other Ambulatory Visit: Payer: Self-pay | Admitting: Neurology

## 2012-08-08 DIAGNOSIS — F0280 Dementia in other diseases classified elsewhere without behavioral disturbance: Secondary | ICD-10-CM

## 2012-08-08 DIAGNOSIS — N401 Enlarged prostate with lower urinary tract symptoms: Secondary | ICD-10-CM

## 2012-08-08 DIAGNOSIS — E785 Hyperlipidemia, unspecified: Secondary | ICD-10-CM

## 2012-08-08 DIAGNOSIS — N138 Other obstructive and reflux uropathy: Secondary | ICD-10-CM

## 2012-08-10 ENCOUNTER — Ambulatory Visit
Admission: RE | Admit: 2012-08-10 | Discharge: 2012-08-10 | Disposition: A | Discharge status: Home / Self Care | Payer: Medicare Other | Source: Ambulatory Visit | Attending: Neurology | Admitting: Neurology

## 2012-08-10 DIAGNOSIS — E785 Hyperlipidemia, unspecified: Secondary | ICD-10-CM

## 2012-08-10 DIAGNOSIS — F0280 Dementia in other diseases classified elsewhere without behavioral disturbance: Secondary | ICD-10-CM

## 2012-08-10 DIAGNOSIS — N138 Other obstructive and reflux uropathy: Secondary | ICD-10-CM

## 2012-08-10 DIAGNOSIS — N401 Enlarged prostate with lower urinary tract symptoms: Secondary | ICD-10-CM

## 2012-11-11 ENCOUNTER — Other Ambulatory Visit: Payer: Self-pay | Admitting: Cardiology

## 2012-11-21 ENCOUNTER — Other Ambulatory Visit: Payer: Self-pay | Admitting: Cardiology

## 2012-11-21 NOTE — H&P (Signed)
Office Visit     Patient: Johnathan Browning, Johnathan Browning Account Number: [id number] Provider: Altie Savard, MD  DOB: 11/30/1930 Age: 79 Y Sex: Male Date: 11/11/2012  Phone: [phone]   Address: [address], [place], [place]  Pcp: DAVID [name]          1. TT/Stress test f/u.        HPI:  General:  The patient presents today for followup of SOB and chest tightness. The tightness is exertional. He says that he gets SOB with the tightness but denies any nausea or diaphoresis. The chest discomfort occurs when he uses his stationary bike as well as when he takes and bath and gets dressed in the am. He denies any LE edema, palpitations or syncope. Since having the stress test he has had 3 episodes of chest pain. He had one episode this am while sitting eating breakfast. He says he was SOB but did not have any pain but says it felt tight. This lasted about 5 minutes and resolved. .        ROS:  See HPI, A twelve system review was perfomed at today's visit. For pertinent positives and negatives see HPI.       Medical History: Hypercholesterolemia, Pneumovax given after age 67 already per wife's report, H/o kidney stone around 1993.        Surgical History: lap choly , vasectomy , endoscopy , hernia surgery 02/2012.        Family History: Father: MI age 73, CVA age 65Mother: MI age 73, breast cancer       Social History:  General:  History of smoking cigarettes: Never smoked.  no Smoking.  no Alcohol.  no Recreational drug use.  Exercise: walking once in a while.  Occupation: retired.  Marital Status: married, Peggy.  Children: Eros Jr "Daniel"; Melissa.  Religion: [employer].        Medications: Taking Aspirin 81 MG Tablet Delayed Release 1 tablet Once a day, Taking MiraLax Powder one capful p.o. q.Browning. p.r.n. constipation one as needed, Taking Simvastatin 10 MG Tablet 1 tablet every evening Once a day, Taking Finasteride 5 MG Tablet 1 tablet Once a day, Taking  Galantamine Hydrobromide 8 MG Tablet 1 tablet Twice a day, Taking Anusol-HC 25 MG Suppository 1 suppository Hasnt started yet, Medication List reviewed and reconciled with the patient       Allergies: N.K.Browning.A.          Vitals: Wt 158.4, Wt change 2 lb, Ht 67, BMI 24.81, Pulse sitting 60, BP sitting 110/80.       Examination:  Cardiology, General:  GENERAL APPEARANCE: pleasant, NAD.  HEENT: unremarkable.  CAROTID UPSTROKE: normal, no bruit.  JVD: flat.  HEART SOUNDS: regular, normal S1, S2, no S3 or S4.  MURMUR: absent.  LUNGS: no rales or wheezes.  ABDOMEN: soft, non tender, positive bowel sounds, no masses felt.  EXTREMITIES: no leg edema.  PERIPHERAL PULSES: 2 plus bilateral.            Assessment:  1. Chest pain - 786.50 (Primary)  2. SOB (shortness of breath) - 786.05        1. Chest pain  LAB: PT (Prothrombin Time) (005199)    Prothrombin Time 10.6  9.1-12.0 - SEC   INR 1.0  0.8-1.2 -    LAB: Basic Metabolic    GLUCOSE 109 H 70-99 - mg/dL   BUN 15  6-26 - mg/dL   CREATININE 0.87  0.60-1.30 - mg/dl   eGFR (  NON-AFRICAN AMERICAN) 85  >60 - calc   eGFR (AFRICAN AMERICAN) 102  >60 - calc   SODIUM 141  136-145 - mmol/L   POTASSIUM 4.0  3.5-5.5 - mmol/L   CHLORIDE 108 H 98-107 - mmol/L   C02 31  22-32 - mmol/L   ANION GAP 5.6 LL 6.0-20.0 - mmol/L   CALCIUM 9.5  8.6-10.3 - mg/dL   LAB: CBC with Diff    WBC 6.8  4.0-11.0 - K/ul   RBC 4.81  4.20-5.80 - M/uL   HGB 15.7  13.0-17.0 - g/dL   HCT 46.9  39.0-52.0 - %   MCH 32.6  27.0-33.0 - pg   MPV 8.6  7.5-10.7 - fL   MCV 97.4 H 80.0-94.0 - fL   MCHC 33.4  32.0-36.0 - g/dL   RDW 12.8  11.5-15.5 - %   NRBC# 0.01  -    PLT 130 L 150-400 - K/uL   NEUT % 59.9  43.3-71.9 - %   NRBC% 0.10  - %   LYMPH% 24.9  16.8-43.5 - %   MONO % 11.0  4.6-12.4 - %   EOS % 3.5  0.0-7.8 - %   BASO % 0.7  0.0-1.0 - %   NEUT # 4.1  1.9-7.2 - K/uL   LYMPH# 1.70  1.10-2.70 - K/uL   MONO # 0.8  0.3-0.8 - K/uL   EOS # 0.2  0.0-0.6 -  K/uL   BASO # 0.1  0.0-0.1 - K/uL    Imaging: Cardiac Cath (Ordered for 11/14/2012) Notes: He continues to have CP and SOB despite a normal nuclear stress test. I have therefore recommended that we proceed with cardiac catheterization to evaluate his coronary anatomy. I have told him to continue his ASA. If he has chest pain over the weekend that does not resolve within 5-10 minutes with rest he needs to call 911., Risks and benefits of cardiac catheterization have been reviewed including risk of stroke, heart attack, death, bleeding, renal impariment and arterial damage. There was ample oppurtuny to answer questions. Alternatives were discussed. Patient understands and wishes to proceed.        Procedures:  Venipuncture:  Venipuncture: Gasanova,Svetlana 11/11/2012 11:36:54 AM > , performed in left arm.           Procedure Codes: 80048 ECL BMP, 85025 ECL CBC PLATELET DIFF, 36415 BLOOD COLLECTION ROUTINE VENIPUNCTURE       Follow Up: cath        Care Plan Details:   Provider: Blue Winther, MD  Patient: Christman, Vaiden Browning DOB: 03/28/1931 Date: 11/11/2012       

## 2012-11-22 ENCOUNTER — Inpatient Hospital Stay (HOSPITAL_BASED_OUTPATIENT_CLINIC_OR_DEPARTMENT_OTHER)
Admission: RE | Admit: 2012-11-22 | Discharge: 2012-11-22 | Disposition: A | Discharge status: Home / Self Care | Payer: Medicare Other | Source: Ambulatory Visit | Attending: Cardiology | Admitting: Cardiology

## 2012-11-22 ENCOUNTER — Encounter (HOSPITAL_BASED_OUTPATIENT_CLINIC_OR_DEPARTMENT_OTHER)
Admission: RE | Disposition: A | Discharge status: Home / Self Care | Payer: Self-pay | Source: Ambulatory Visit | Attending: Cardiology

## 2012-11-22 DIAGNOSIS — R0602 Shortness of breath: Secondary | ICD-10-CM | POA: Insufficient documentation

## 2012-11-22 DIAGNOSIS — R0789 Other chest pain: Secondary | ICD-10-CM | POA: Insufficient documentation

## 2012-11-22 DIAGNOSIS — I519 Heart disease, unspecified: Secondary | ICD-10-CM | POA: Insufficient documentation

## 2012-11-22 DIAGNOSIS — E7800 Pure hypercholesterolemia, unspecified: Secondary | ICD-10-CM | POA: Insufficient documentation

## 2012-11-22 DIAGNOSIS — R079 Chest pain, unspecified: Secondary | ICD-10-CM

## 2012-11-22 SURGERY — JV LEFT HEART CATHETERIZATION WITH CORONARY ANGIOGRAM
Anesthesia: Moderate Sedation

## 2012-11-22 MED ORDER — ASPIRIN 81 MG PO CHEW
324.0000 mg | CHEWABLE_TABLET | ORAL | Status: AC
  Administered 2012-11-22: 324 mg via ORAL

## 2012-11-22 MED ORDER — SODIUM CHLORIDE 0.9 % IJ SOLN: 3.0000 mL | INTRAMUSCULAR | Status: DC | PRN

## 2012-11-22 MED ORDER — ACETAMINOPHEN 325 MG PO TABS: 650.0000 mg | ORAL_TABLET | ORAL | Status: DC | PRN

## 2012-11-22 MED ORDER — SODIUM CHLORIDE 0.9 % IJ SOLN: 3.0000 mL | Freq: Two times a day (BID) | INTRAMUSCULAR | Status: DC

## 2012-11-22 MED ORDER — SODIUM CHLORIDE 0.9 % IV SOLN: 1.0000 mL/kg/h | INTRAVENOUS | Status: DC

## 2012-11-22 MED ORDER — DIAZEPAM 5 MG PO TABS
5.0000 mg | ORAL_TABLET | ORAL | Status: AC
  Administered 2012-11-22: 5 mg via ORAL

## 2012-11-22 MED ORDER — ONDANSETRON HCL 4 MG/2ML IJ SOLN: 4.0000 mg | Freq: Four times a day (QID) | INTRAMUSCULAR | Status: DC | PRN

## 2012-11-22 MED ORDER — SODIUM CHLORIDE 0.9 % IV SOLN: 250.0000 mL | INTRAVENOUS | Status: DC | PRN

## 2012-11-22 MED ORDER — SODIUM CHLORIDE 0.9 % IV SOLN: INTRAVENOUS | Status: DC

## 2012-11-22 NOTE — OR Nursing (Signed)
Dr Turner at bedside to discuss results and treatment plan with pt and family 

## 2012-11-22 NOTE — H&P (View-Only) (Signed)
Office Visit     Patient: Johnathan Browning, Johnathan Browning Account Number: [id number] Provider: Armanda Magic, MD  DOB: 12/01/1930 Age: 79 Y Sex: Male Date: 11/11/2012  Phone: [phone]   Address: [address] [place], [place]  Pcp: DAVID [name]          1. TT/Stress test f/u.        HPI:  General:  The patient presents today for followup of SOB and chest tightness. The tightness is exertional. He says that he gets SOB with the tightness but denies any nausea or diaphoresis. The chest discomfort occurs when he uses his stationary bike as well as when he takes and bath and gets dressed in the am. He denies any LE edema, palpitations or syncope. Since having the stress test he has had 3 episodes of chest pain. He had one episode this am while sitting eating breakfast. He says he was SOB but did not have any pain but says it felt tight. This lasted about 5 minutes and resolved. .        ROS:  See HPI, A twelve system review was perfomed at today's visit. For pertinent positives and negatives see HPI.       Medical History: Hypercholesterolemia, Pneumovax given after age 28 already per wife's report, H/o kidney stone around 69.        Surgical History: lap choly , vasectomy , endoscopy , hernia surgery 02/2012.        Family History: Father: MI age 56, CVA age : MI age 45, breast cancer       Social History:  General:  History of smoking cigarettes: Never smoked.  no Smoking.  no Alcohol.  no Recreational drug use.  Exercise: walking once in a while.  Occupation: retired.  Marital Status: married, Clinical cytogeneticist.  Children: Chidubem Chaires "Reuel Boom"; Melissa.  Religion: [employer].        Medications: Taking Aspirin 81 MG Tablet Delayed Release 1 tablet Once a day, Taking MiraLax Powder one capful p.o. q.d. p.r.n. constipation one as needed, Taking Simvastatin 10 MG Tablet 1 tablet every evening Once a day, Taking Finasteride 5 MG Tablet 1 tablet Once a day, Taking  Galantamine Hydrobromide 8 MG Tablet 1 tablet Twice a day, Taking Anusol-HC 25 MG Suppository 1 suppository Hasnt started yet, Medication List reviewed and reconciled with the patient       Allergies: N.K.D.A.          Vitals: Wt 158.4, Wt change 2 lb, Ht 67, BMI 24.81, Pulse sitting 60, BP sitting 110/80.       Examination:  Cardiology, General:  GENERAL APPEARANCE: pleasant, NAD.  HEENT: unremarkable.  CAROTID UPSTROKE: normal, no bruit.  JVD: flat.  HEART SOUNDS: regular, normal S1, S2, no S3 or S4.  MURMUR: absent.  LUNGS: no rales or wheezes.  ABDOMEN: soft, non tender, positive bowel sounds, no masses felt.  EXTREMITIES: no leg edema.  PERIPHERAL PULSES: 2 plus bilateral.            Assessment:  1. Chest pain - 786.50 (Primary)  2. SOB (shortness of breath) - 786.05        1. Chest pain  LAB: PT (Prothrombin Time) (562130)    Prothrombin Time 10.6  9.1-12.0 - SEC   INR 1.0  0.8-1.2 -    LAB: Basic Metabolic    GLUCOSE 109 H 70-99 - mg/dL   BUN 15  8-65 - mg/dL   CREATININE 7.84  6.96-2.95 - mg/dl   eGFR (  NON-AFRICAN AMERICAN) 85  >60 - calc   eGFR (AFRICAN AMERICAN) 102  >60 - calc   SODIUM 141  136-145 - mmol/L   POTASSIUM 4.0  3.5-5.5 - mmol/L   CHLORIDE 108 H 98-107 - mmol/L   C02 31  22-32 - mmol/L   ANION GAP 5.6 LL 6.0-20.0 - mmol/L   CALCIUM 9.5  8.6-10.3 - mg/dL   LAB: CBC with Diff    WBC 6.8  4.0-11.0 - K/ul   RBC 4.81  4.20-5.80 - M/uL   HGB 15.7  13.0-17.0 - g/dL   HCT 78.2  95.6-21.3 - %   MCH 32.6  27.0-33.0 - pg   MPV 8.6  7.5-10.7 - fL   MCV 97.4 H 80.0-94.0 - fL   MCHC 33.4  32.0-36.0 - g/dL   RDW 08.6  57.8-46.9 - %   NRBC# 0.01  -    PLT 130 L 150-400 - K/uL   NEUT % 59.9  43.3-71.9 - %   NRBC% 0.10  - %   LYMPH% 24.9  16.8-43.5 - %   MONO % 11.0  4.6-12.4 - %   EOS % 3.5  0.0-7.8 - %   BASO % 0.7  0.0-1.0 - %   NEUT # 4.1  1.9-7.2 - K/uL   LYMPH# 1.70  1.10-2.70 - K/uL   MONO # 0.8  0.3-0.8 - K/uL   EOS # 0.2  0.0-0.6 -  K/uL   BASO # 0.1  0.0-0.1 - K/uL    Imaging: Cardiac Cath (Ordered for 11/14/2012) Notes: He continues to have CP and SOB despite a normal nuclear stress test. I have therefore recommended that we proceed with cardiac catheterization to evaluate his coronary anatomy. I have told him to continue his ASA. If he has chest pain over the weekend that does not resolve within 5-10 minutes with rest he needs to call 911., Risks and benefits of cardiac catheterization have been reviewed including risk of stroke, heart attack, death, bleeding, renal impariment and arterial damage. There was ample oppurtuny to answer questions. Alternatives were discussed. Patient understands and wishes to proceed.        Procedures:  Venipuncture:  Venipuncture: Gasanova,Svetlana 11/11/2012 11:36:54 AM > , performed in left arm.           Procedure Codes: 62952 ECL BMP, 85025 ECL CBC PLATELET DIFF, 84132 BLOOD COLLECTION ROUTINE VENIPUNCTURE       Follow Up: cath        Care Plan Details:   Provider: Armanda Magic, MD  Patient: Johnathan Browning, Johnathan Browning DOB: [birth year] Date: 11/11/2012

## 2012-11-22 NOTE — OR Nursing (Signed)
Meal served 

## 2012-11-22 NOTE — OR Nursing (Signed)
Discharge instructions reviewed and signed, patient's wife is caretaker and stated understanding, ambulated in hall without difficulty, site level 0, transported to friend's car via wheelchair

## 2012-11-22 NOTE — Interval H&P Note (Signed)
History and Physical Interval Note:  11/22/2012 9:02 AM  Johnathan Browning  has presented today for surgery, with the diagnosis of abn ST/cp/sob  The various methods of treatment have been discussed with the patient and family. After consideration of risks, benefits and other options for treatment, the patient has consented to  Procedure(s): JV LEFT HEART CATHETERIZATION WITH CORONARY ANGIOGRAM (N/A) as a surgical intervention .  The patient's history has been reviewed, patient examined, no change in status, stable for surgery.  I have reviewed the patient's chart and labs.  Questions were answered to the patient's satisfaction.     TURNER,TRACI R

## 2012-11-22 NOTE — OR Nursing (Signed)
Tegaderm dressing applied, site level 0, bedrest begins at 0945 

## 2012-11-22 NOTE — CV Procedure (Signed)
PROCEDURE:  Left heart catheterization with selective coronary angiography, left ventriculogram.  INDICATIONS:  Chest pain  The risks, benefits, and details of the procedure were explained to the patient.  The patient verbalized understanding and wanted to proceed.  Informed written consent was obtained.  PROCEDURE TECHNIQUE:  After Xylocaine anesthesia a 68F sheath was placed in the right femoral artery with a single anterior needle wall stick.   Left coronary angiography was done using a Judkins L4 guide catheter.  Right coronary angiography was done using a Judkins R4 guide catheter.  Left ventriculography was done using a pigtail catheter.    CONTRAST:  Total of 70 cc.  COMPLICATIONS:  None.    HEMODYNAMICS:  Aortic pressure was 135/43mmHg; LV pressure was 136/66mmHg; LVEDP .  There was no gradient between the left ventricle and aorta.    ANGIOGRAPHIC DATA:   The left main coronary artery is widely patent and bifurcates into an LAD and left circumflex arteries.    The left anterior descending artery is widely patent.  It gives rise to a first diagonal which is widely patent and then a second diagonal which is large and patent.  The ongoing LAD is widely patent throughout its course distally.  The left circumflex artery is very large and dominant.  It gives rise to a moderate sized first OM and then a very large second OM both of which are widely patent.  The ongoing left circ is patent and gives rise to a PDA which is widely patent.    The right coronary artery is small and nondominant and is patent.  LEFT VENTRICULOGRAM:  Left ventricular angiogram was done in the 30 RAO projection and revealed normal left ventricular wall motion and systolic function with an estimated ejection fraction of 60%.  LVEDP was 18 mmHg.  IMPRESSIONS:  1. Normal left main coronary artery. 2. Normal left anterior descending artery and its branches. 3. Normal left circumflex artery and its  branches. 4. Normal right coronary artery. 5. Normal left ventricular systolic function.  LVEDP 18 mmHg.  Ejection fraction 60%. 6.  Diastolic dysfunction with elevated LVEDP  RECOMMENDATION:   1.  D/C home once bedrest complete. 2.  Continue home meds 3.  Followup with primary MD for workup of noncardiac chest pain

## 2013-01-10 ENCOUNTER — Other Ambulatory Visit: Payer: Self-pay | Admitting: Family Medicine

## 2013-01-10 ENCOUNTER — Ambulatory Visit
Admission: RE | Admit: 2013-01-10 | Discharge: 2013-01-10 | Disposition: A | Discharge status: Home / Self Care | Payer: Medicare Other | Source: Ambulatory Visit | Attending: Family Medicine | Admitting: Family Medicine

## 2013-01-10 DIAGNOSIS — R109 Unspecified abdominal pain: Secondary | ICD-10-CM

## 2014-01-19 ENCOUNTER — Ambulatory Visit: Payer: Self-pay

## 2014-05-24 ENCOUNTER — Other Ambulatory Visit: Payer: Self-pay | Admitting: Family Medicine

## 2014-05-24 ENCOUNTER — Ambulatory Visit
Admission: RE | Admit: 2014-05-24 | Discharge: 2014-05-24 | Disposition: A | Discharge status: Home / Self Care | Payer: Medicare HMO | Source: Ambulatory Visit | Attending: Family Medicine | Admitting: Family Medicine

## 2014-05-24 DIAGNOSIS — R059 Cough, unspecified: Secondary | ICD-10-CM

## 2014-05-24 DIAGNOSIS — R05 Cough: Secondary | ICD-10-CM

## 2014-06-19 ENCOUNTER — Emergency Department (HOSPITAL_COMMUNITY)
Admission: EM | Admit: 2014-06-19 | Discharge: 2014-06-19 | Disposition: A | Discharge status: Home / Self Care | Payer: Medicare HMO | Attending: Emergency Medicine | Admitting: Emergency Medicine

## 2014-06-19 ENCOUNTER — Encounter (HOSPITAL_COMMUNITY): Payer: Self-pay | Admitting: Emergency Medicine

## 2014-06-19 ENCOUNTER — Emergency Department (HOSPITAL_COMMUNITY): Payer: Medicare HMO

## 2014-06-19 DIAGNOSIS — Z79899 Other long term (current) drug therapy: Secondary | ICD-10-CM | POA: Diagnosis not present

## 2014-06-19 DIAGNOSIS — Z87442 Personal history of urinary calculi: Secondary | ICD-10-CM | POA: Insufficient documentation

## 2014-06-19 DIAGNOSIS — Z7982 Long term (current) use of aspirin: Secondary | ICD-10-CM | POA: Insufficient documentation

## 2014-06-19 DIAGNOSIS — E785 Hyperlipidemia, unspecified: Secondary | ICD-10-CM | POA: Diagnosis not present

## 2014-06-19 DIAGNOSIS — G309 Alzheimer's disease, unspecified: Secondary | ICD-10-CM | POA: Diagnosis not present

## 2014-06-19 DIAGNOSIS — F0280 Dementia in other diseases classified elsewhere without behavioral disturbance: Secondary | ICD-10-CM | POA: Insufficient documentation

## 2014-06-19 DIAGNOSIS — Z87448 Personal history of other diseases of urinary system: Secondary | ICD-10-CM | POA: Insufficient documentation

## 2014-06-19 DIAGNOSIS — R0789 Other chest pain: Secondary | ICD-10-CM

## 2014-06-19 DIAGNOSIS — W19XXXA Unspecified fall, initial encounter: Secondary | ICD-10-CM

## 2014-06-19 DIAGNOSIS — S299XXA Unspecified injury of thorax, initial encounter: Secondary | ICD-10-CM | POA: Insufficient documentation

## 2014-06-19 LAB — BASIC METABOLIC PANEL
Anion gap: 10 (ref 5–15)
BUN: 13 mg/dL (ref 6–23)
CHLORIDE: 105 meq/L (ref 96–112)
CO2: 25 meq/L (ref 19–32)
CREATININE: 0.8 mg/dL (ref 0.50–1.35)
Calcium: 8.7 mg/dL (ref 8.4–10.5)
GFR calc non Af Amer: 82 mL/min — ABNORMAL LOW (ref >90)
Glucose, Bld: 123 mg/dL — ABNORMAL HIGH (ref 70–99)
POTASSIUM: 4 meq/L (ref 3.7–5.3)
Sodium: 140 mEq/L (ref 137–147)

## 2014-06-19 LAB — TROPONIN I: Troponin I: <0.3 ng/mL (ref <0.30)

## 2014-06-19 LAB — URINALYSIS, ROUTINE W REFLEX MICROSCOPIC
Bilirubin Urine: NEGATIVE
GLUCOSE, UA: 250 mg/dL — AB
Ketones, ur: NEGATIVE mg/dL
LEUKOCYTES UA: NEGATIVE
Nitrite: NEGATIVE
PROTEIN: NEGATIVE mg/dL
SPECIFIC GRAVITY, URINE: 1.015 (ref 1.005–1.030)
UROBILINOGEN UA: 1 mg/dL (ref 0.0–1.0)
pH: 6 (ref 5.0–8.0)

## 2014-06-19 LAB — CBC WITH DIFFERENTIAL/PLATELET
Basophils Absolute: 0 10*3/uL (ref 0.0–0.1)
Basophils Relative: 0 % (ref 0–1)
Eosinophils Absolute: 0.1 10*3/uL (ref 0.0–0.7)
Eosinophils Relative: 2 % (ref 0–5)
HEMATOCRIT: 43 % (ref 39.0–52.0)
HEMOGLOBIN: 14.7 g/dL (ref 13.0–17.0)
LYMPHS ABS: 1.5 10*3/uL (ref 0.7–4.0)
LYMPHS PCT: 23 % (ref 12–46)
MCH: 33 pg (ref 26.0–34.0)
MCHC: 34.2 g/dL (ref 30.0–36.0)
MCV: 96.4 fL (ref 78.0–100.0)
MONO ABS: 0.6 10*3/uL (ref 0.1–1.0)
MONOS PCT: 9 % (ref 3–12)
NEUTROS ABS: 4.3 10*3/uL (ref 1.7–7.7)
NEUTROS PCT: 66 % (ref 43–77)
Platelets: 121 10*3/uL — ABNORMAL LOW (ref 150–400)
RBC: 4.46 MIL/uL (ref 4.22–5.81)
RDW: 13.4 % (ref 11.5–15.5)
WBC: 6.5 10*3/uL (ref 4.0–10.5)

## 2014-06-19 LAB — URINE MICROSCOPIC-ADD ON

## 2014-06-19 MED ORDER — ACETAMINOPHEN 500 MG PO TABS: 500.0000 mg | ORAL_TABLET | Freq: Four times a day (QID) | ORAL | Status: DC | PRN

## 2014-06-19 MED ORDER — ACETAMINOPHEN 325 MG PO TABS
650.0000 mg | ORAL_TABLET | Freq: Once | ORAL | Status: AC
  Administered 2014-06-19: 650 mg via ORAL
  Filled 2014-06-19: qty 2

## 2014-06-19 NOTE — Discharge Instructions (Signed)
You were seen today following a fall and for chest wall pain. Pain is likely secondary to fall. See return precautions below.  Chest Pain (Nonspecific) It is often hard to give a diagnosis for the cause of chest pain. There is always a chance that your pain could be related to something serious, such as a heart attack or a blood clot in the lungs. You need to follow up with your doctor. HOME CARE  If antibiotic medicine was given, take it as directed by your doctor. Finish the medicine even if you start to feel better.  For the next few days, avoid activities that bring on chest pain. Continue physical activities as told by your doctor.  Do not use any tobacco products. This includes cigarettes, chewing tobacco, and e-cigarettes.  Avoid drinking alcohol.  Only take medicine as told by your doctor.  Follow your doctor's suggestions for more testing if your chest pain does not go away.  Keep all doctor visits you made. GET HELP IF:  Your chest pain does not go away, even after treatment.  You have a rash with blisters on your chest.  You have a fever. GET HELP RIGHT AWAY IF:   You have more pain or pain that spreads to your arm, neck, jaw, back, or belly (abdomen).  You have shortness of breath.  You cough more than usual or cough up blood.  You have very bad back or belly pain.  You feel sick to your stomach (nauseous) or throw up (vomit).  You have very bad weakness.  You pass out (faint).  You have chills. This is an emergency. Do not wait to see if the problems will go away. Call your local emergency services (911 in [place] Do not drive yourself to the hospital. MAKE SURE YOU:   Understand these instructions.  Will watch your condition.  Will get help right away if you are not doing well or get worse. Document Released: 02/03/2008 Document Revised: 08/22/2013 Document Reviewed: 02/03/2008 [facility] Patient Information 2015 [employer], [place]. This information is not  intended to replace advice given to you by your health care provider. Make sure you discuss any questions you have with your health care provider.

## 2014-06-19 NOTE — ED Provider Notes (Signed)
CSN: [id number]     Arrival date & time 06/19/14  [id number] History   First MD Initiated Contact with Patient 06/19/14 1005     Chief Complaint  Patient presents with  . Chest Pain    fall     (Consider location/radiation/quality/duration/timing/severity/associated sxs/prior Treatment) HPI  This is an 80 year old male with history of Alzheimer's dementia who [facility] from [facility].  Per EMS report, he fell yesterday. He was complaining of chest pain this morning. He is not on any anticoagulants based on chart review. History of dementia and only oriented to himself. He denies any pain at this time. Details of fall unknown. No known history of coronary artery disease. Patient's wife is at the bedside.  Level V caveat for dementia  Past Medical History  Diagnosis Date  . Alzheimer's dementia     for 10 years  . Right inguinal hernia   . Gross hematuria   . Elevated PSA 2001; 2003    bx negative  . History of nephrolithiasis   . History of esophageal reflux   . Enlarged prostate   . Hyperlipidemia   . GERD (gastroesophageal reflux disease)   . Blood in urine   . Abdominal pain   . Constipation   . Easy bruising   . Blood in stool   . Inguinal hernia   . Shortness of breath 03-11-12    with exertion   Past Surgical History  Procedure Laterality Date  . Cholecystectomy  07/2004  . Inguinal hernia repair  03/17/2012    Procedure: HERNIA REPAIR INGUINAL ADULT;  Surgeon: Adolph Pollackodd J Rosenbower, MD;  Location: [facility];  Service: General;  Laterality: Right;  . Hernia repair  03/17/12    RIH   Family History  Problem Relation Age of Onset  . Cancer Mother     breast  . Heart disease Mother   . Stroke Father    History  Substance Use Topics  . Smoking status: Never Smoker   . Smokeless tobacco: Never Used  . Alcohol Use: No    Review of Systems  Unable to perform ROS: Dementia      Allergies  Review of patient's allergies indicates no known allergies.  Home  Medications   Prior to Admission medications   Medication Sig Start Date End Date Taking? Authorizing Provider  aspirin EC 81 MG tablet Take 81 mg by mouth daily with breakfast.    Yes Historical Provider, MD  bimatoprost (LUMIGAN) 0.01 % SOLN Place 1 drop into both eyes at bedtime.   Yes Historical Provider, MD  finasteride (PROSCAR) 5 MG tablet Take 5 mg by mouth daily with breakfast.    Yes Historical Provider, MD  acetaminophen (TYLENOL) 500 MG tablet Take 1 tablet (500 mg total) by mouth every 6 (six) hours as needed. 06/19/14   Shon Batonourtney F Horton, MD   BP 132/70  Pulse 66  Temp(Src) 97.8 F (36.6 C) (Oral)  Resp 18  SpO2 100% Physical Exam  Nursing note and vitals reviewed. Constitutional: No distress.  Elderly  HENT:  Head: Normocephalic and atraumatic.  Eyes: EOM are normal. Pupils are equal, round, and reactive to light.  Neck: Neck supple.  Cardiovascular: Normal rate, regular rhythm and normal heart sounds.   No murmur heard. Pulmonary/Chest: Effort normal and breath sounds normal. No respiratory distress. He has no wheezes. He exhibits tenderness.  Left lower chest wall tenderness without crepitus  Abdominal: Soft. Bowel sounds are normal. There is no tenderness. There is no rebound.  Musculoskeletal: He exhibits no edema.  Neurological: He is alert.  Oriented only to self  Skin: Skin is warm and dry.  Psychiatric: He has a normal mood and affect.    ED Course  Procedures (including critical care time) Labs Review Labs Reviewed  CBC WITH DIFFERENTIAL - Abnormal; Notable for the following:    Platelets 121 (*)    All other components within normal limits  BASIC METABOLIC PANEL - Abnormal; Notable for the following:    Glucose, Bld 123 (*)    GFR calc non Af Amer 82 (*)    All other components within normal limits  URINALYSIS, ROUTINE W REFLEX MICROSCOPIC - Abnormal; Notable for the following:    Glucose, UA 250 (*)    Hgb urine dipstick TRACE (*)    All  other components within normal limits  TROPONIN I  URINE MICROSCOPIC-ADD ON    Imaging Review Dg Chest 2 View  06/19/2014   CLINICAL DATA:  Chest pain which started yesterday.  EXAM: CHEST  2 VIEW  COMPARISON:  05/24/2014  FINDINGS: The heart size and mediastinal contours are within normal limits. Low lung volumes with vascular crowding and streaky atelectasis. Both lungs are otherwise clear. The visualized skeletal structures are unremarkable.  IMPRESSION: Low lung volumes with mild vascular crowding and streaky atelectasis but no infiltrates, edema or effusions.   Electronically Signed   By: Loralie ChampagneMark  Gallerani M.D.   On: 06/19/2014 11:02     EKG Interpretation   Date/Time:  Tuesday June 19 2014 09:51:48 EDT Ventricular Rate:  73 PR Interval:  154 QRS Duration: 92 QT Interval:  390 QTC Calculation: 430 R Axis:   42 Text Interpretation:  Sinus rhythm Baseline wander in lead(s) V2 Confirmed  by HORTON  MD, COURTNEY ([id number]) on 06/19/2014 10:06:27 AM      MDM   Final diagnoses:  Fall, initial encounter  Chest wall pain    Patient presents with CP.  Reported fall yesterday at nursing facility.  At baseline per wife.  TTP over the left lower chest wall without crepitus.  Patient has no complaints at this time.  EKG reassuring and w/u including urinalysis and troponin neg.  Discussed with wife and son in law, treatment with tylenol for pain (to avoid polypharmacy).  They stated understanding.  After history, exam, and medical workup I feel the patient has been appropriately medically screened and is safe for discharge home. Pertinent diagnoses were discussed with the patient. Patient was given return precautions.     Shon Batonourtney F Horton, MD 06/19/14 [phone]

## 2014-06-19 NOTE — ED Notes (Signed)
Per Ems pt from nursing Facility ( [facility]) fell yesterday. Pt co of chest wall pain tha tstarted this AM. Pt denies anticoagulants. Hx of dementia. Pt alert and oriented to person, place and situation.

## 2014-06-19 NOTE — ED Notes (Signed)
Bed: ZO10WA22 Expected date:  Expected time:  Means of arrival:  Comments: ems- 80 yo

## 2014-06-22 ENCOUNTER — Emergency Department (HOSPITAL_COMMUNITY): Payer: Medicare HMO

## 2014-06-22 ENCOUNTER — Encounter (HOSPITAL_COMMUNITY): Payer: Self-pay | Admitting: Emergency Medicine

## 2014-06-22 ENCOUNTER — Inpatient Hospital Stay (HOSPITAL_COMMUNITY)
Admission: EM | Admit: 2014-06-22 | Discharge: 2014-07-05 | DRG: 699 | Disposition: A | Discharge status: Nursing Home (Includes ICF) | Payer: Medicare HMO | Attending: Internal Medicine | Admitting: Internal Medicine

## 2014-06-22 DIAGNOSIS — N179 Acute kidney failure, unspecified: Secondary | ICD-10-CM | POA: Diagnosis not present

## 2014-06-22 DIAGNOSIS — R310 Gross hematuria: Secondary | ICD-10-CM | POA: Diagnosis present

## 2014-06-22 DIAGNOSIS — R0789 Other chest pain: Secondary | ICD-10-CM

## 2014-06-22 DIAGNOSIS — F0281 Dementia in other diseases classified elsewhere with behavioral disturbance: Secondary | ICD-10-CM | POA: Diagnosis present

## 2014-06-22 DIAGNOSIS — N320 Bladder-neck obstruction: Principal | ICD-10-CM

## 2014-06-22 DIAGNOSIS — R4689 Other symptoms and signs involving appearance and behavior: Secondary | ICD-10-CM

## 2014-06-22 DIAGNOSIS — Z87442 Personal history of urinary calculi: Secondary | ICD-10-CM

## 2014-06-22 DIAGNOSIS — F02818 Dementia in other diseases classified elsewhere, unspecified severity, with other behavioral disturbance: Secondary | ICD-10-CM

## 2014-06-22 DIAGNOSIS — W19XXXA Unspecified fall, initial encounter: Secondary | ICD-10-CM

## 2014-06-22 DIAGNOSIS — G309 Alzheimer's disease, unspecified: Secondary | ICD-10-CM | POA: Diagnosis present

## 2014-06-22 DIAGNOSIS — Z7982 Long term (current) use of aspirin: Secondary | ICD-10-CM

## 2014-06-22 DIAGNOSIS — F0391 Unspecified dementia with behavioral disturbance: Secondary | ICD-10-CM

## 2014-06-22 DIAGNOSIS — Z9049 Acquired absence of other specified parts of digestive tract: Secondary | ICD-10-CM | POA: Diagnosis present

## 2014-06-22 DIAGNOSIS — Z66 Do not resuscitate: Secondary | ICD-10-CM | POA: Diagnosis present

## 2014-06-22 DIAGNOSIS — E785 Hyperlipidemia, unspecified: Secondary | ICD-10-CM | POA: Diagnosis present

## 2014-06-22 DIAGNOSIS — N139 Obstructive and reflux uropathy, unspecified: Secondary | ICD-10-CM

## 2014-06-22 DIAGNOSIS — N401 Enlarged prostate with lower urinary tract symptoms: Secondary | ICD-10-CM | POA: Diagnosis present

## 2014-06-22 DIAGNOSIS — D696 Thrombocytopenia, unspecified: Secondary | ICD-10-CM | POA: Diagnosis present

## 2014-06-22 DIAGNOSIS — R451 Restlessness and agitation: Secondary | ICD-10-CM | POA: Diagnosis present

## 2014-06-22 DIAGNOSIS — R109 Unspecified abdominal pain: Secondary | ICD-10-CM | POA: Diagnosis present

## 2014-06-22 DIAGNOSIS — N138 Other obstructive and reflux uropathy: Secondary | ICD-10-CM | POA: Diagnosis present

## 2014-06-22 DIAGNOSIS — E860 Dehydration: Secondary | ICD-10-CM | POA: Diagnosis present

## 2014-06-22 DIAGNOSIS — F03918 Unspecified dementia, unspecified severity, with other behavioral disturbance: Secondary | ICD-10-CM

## 2014-06-22 DIAGNOSIS — E440 Moderate protein-calorie malnutrition: Secondary | ICD-10-CM | POA: Diagnosis present

## 2014-06-22 DIAGNOSIS — F0390 Unspecified dementia without behavioral disturbance: Secondary | ICD-10-CM | POA: Diagnosis present

## 2014-06-22 DIAGNOSIS — R339 Retention of urine, unspecified: Secondary | ICD-10-CM | POA: Diagnosis present

## 2014-06-22 DIAGNOSIS — K219 Gastro-esophageal reflux disease without esophagitis: Secondary | ICD-10-CM | POA: Diagnosis present

## 2014-06-22 DIAGNOSIS — Z515 Encounter for palliative care: Secondary | ICD-10-CM | POA: Insufficient documentation

## 2014-06-22 DIAGNOSIS — F05 Delirium due to known physiological condition: Secondary | ICD-10-CM | POA: Diagnosis present

## 2014-06-22 DIAGNOSIS — R319 Hematuria, unspecified: Secondary | ICD-10-CM

## 2014-06-22 DIAGNOSIS — N19 Unspecified kidney failure: Secondary | ICD-10-CM | POA: Diagnosis present

## 2014-06-22 HISTORY — DX: Acute kidney failure, unspecified: N17.9

## 2014-06-22 LAB — COMPREHENSIVE METABOLIC PANEL
ALBUMIN: 3.7 g/dL (ref 3.5–5.2)
ALK PHOS: 66 U/L (ref 39–117)
ALT: 14 U/L (ref 0–53)
ANION GAP: 10 (ref 5–15)
AST: 17 U/L (ref 0–37)
BILIRUBIN TOTAL: 0.9 mg/dL (ref 0.3–1.2)
BUN: 14 mg/dL (ref 6–23)
CHLORIDE: 102 meq/L (ref 96–112)
CO2: 28 mEq/L (ref 19–32)
Calcium: 9 mg/dL (ref 8.4–10.5)
Creatinine, Ser: 0.76 mg/dL (ref 0.50–1.35)
GFR calc Af Amer: >90 mL/min (ref >90)
GFR calc non Af Amer: 83 mL/min — ABNORMAL LOW (ref >90)
Glucose, Bld: 118 mg/dL — ABNORMAL HIGH (ref 70–99)
POTASSIUM: 3.8 meq/L (ref 3.7–5.3)
SODIUM: 140 meq/L (ref 137–147)
TOTAL PROTEIN: 6.8 g/dL (ref 6.0–8.3)

## 2014-06-22 LAB — CBC WITH DIFFERENTIAL/PLATELET
BASOS ABS: 0 10*3/uL (ref 0.0–0.1)
BASOS PCT: 0 % (ref 0–1)
EOS ABS: 0.1 10*3/uL (ref 0.0–0.7)
Eosinophils Relative: 3 % (ref 0–5)
HCT: 43.9 % (ref 39.0–52.0)
HEMOGLOBIN: 15.3 g/dL (ref 13.0–17.0)
Lymphocytes Relative: 30 % (ref 12–46)
Lymphs Abs: 1.7 10*3/uL (ref 0.7–4.0)
MCH: 33.8 pg (ref 26.0–34.0)
MCHC: 34.9 g/dL (ref 30.0–36.0)
MCV: 97.1 fL (ref 78.0–100.0)
MONOS PCT: 10 % (ref 3–12)
Monocytes Absolute: 0.6 10*3/uL (ref 0.1–1.0)
NEUTROS ABS: 3.3 10*3/uL (ref 1.7–7.7)
NEUTROS PCT: 57 % (ref 43–77)
PLATELETS: 121 10*3/uL — AB (ref 150–400)
RBC: 4.52 MIL/uL (ref 4.22–5.81)
RDW: 13.5 % (ref 11.5–15.5)
WBC: 5.6 10*3/uL (ref 4.0–10.5)

## 2014-06-22 LAB — URINE MICROSCOPIC-ADD ON

## 2014-06-22 LAB — RAPID URINE DRUG SCREEN, HOSP PERFORMED
AMPHETAMINES: NOT DETECTED
BARBITURATES: NOT DETECTED
BENZODIAZEPINES: NOT DETECTED
COCAINE: NOT DETECTED
Opiates: NOT DETECTED
Tetrahydrocannabinol: NOT DETECTED

## 2014-06-22 LAB — URINALYSIS, ROUTINE W REFLEX MICROSCOPIC
BILIRUBIN URINE: NEGATIVE
GLUCOSE, UA: NEGATIVE mg/dL
KETONES UR: NEGATIVE mg/dL
Leukocytes, UA: NEGATIVE
NITRITE: NEGATIVE
PH: 6 (ref 5.0–8.0)
Protein, ur: NEGATIVE mg/dL
SPECIFIC GRAVITY, URINE: 1.021 (ref 1.005–1.030)
Urobilinogen, UA: 1 mg/dL (ref 0.0–1.0)

## 2014-06-22 LAB — ETHANOL: Alcohol, Ethyl (B): <11 mg/dL (ref 0–11)

## 2014-06-22 MED ORDER — ONDANSETRON HCL 4 MG PO TABS
4.0000 mg | ORAL_TABLET | Freq: Three times a day (TID) | ORAL | Status: DC | PRN
  Administered 2014-06-22: 4 mg via ORAL
  Filled 2014-06-22: qty 1

## 2014-06-22 MED ORDER — QUETIAPINE FUMARATE 25 MG PO TABS
25.0000 mg | ORAL_TABLET | Freq: Once | ORAL | Status: AC
  Administered 2014-06-22: 25 mg via ORAL
  Filled 2014-06-22: qty 1

## 2014-06-22 MED ORDER — ACETAMINOPHEN 325 MG PO TABS
650.0000 mg | ORAL_TABLET | ORAL | Status: DC | PRN
  Administered 2014-06-23 – 2014-06-25 (×3): 650 mg via ORAL
  Filled 2014-06-22 (×3): qty 2

## 2014-06-22 MED ORDER — ASPIRIN EC 81 MG PO TBEC
81.0000 mg | DELAYED_RELEASE_TABLET | Freq: Every day | ORAL | Status: DC
  Administered 2014-06-24 – 2014-06-29 (×4): 81 mg via ORAL
  Filled 2014-06-22 (×10): qty 1

## 2014-06-22 MED ORDER — ZOLPIDEM TARTRATE 10 MG PO TABS
10.0000 mg | ORAL_TABLET | Freq: Every evening | ORAL | Status: DC | PRN
  Administered 2014-06-22: 10 mg via ORAL
  Filled 2014-06-22: qty 1

## 2014-06-22 MED ORDER — IBUPROFEN 200 MG PO TABS: 600.0000 mg | ORAL_TABLET | Freq: Three times a day (TID) | ORAL | Status: DC | PRN

## 2014-06-22 MED ORDER — NICOTINE 21 MG/24HR TD PT24: 21.0000 mg | MEDICATED_PATCH | Freq: Every day | TRANSDERMAL | Status: DC

## 2014-06-22 MED ORDER — LATANOPROST 0.005 % OP SOLN
1.0000 [drp] | Freq: Every day | OPHTHALMIC | Status: DC
  Administered 2014-06-22 – 2014-07-04 (×12): 1 [drp] via OPHTHALMIC
  Filled 2014-06-22 (×2): qty 2.5

## 2014-06-22 MED ORDER — FINASTERIDE 5 MG PO TABS
5.0000 mg | ORAL_TABLET | Freq: Every day | ORAL | Status: DC
  Administered 2014-06-24 – 2014-07-05 (×10): 5 mg via ORAL
  Filled 2014-06-22 (×18): qty 1

## 2014-06-22 MED ORDER — ALUM & MAG HYDROXIDE-SIMETH 200-200-20 MG/5ML PO SUSP
30.0000 mL | ORAL | Status: DC | PRN
  Administered 2014-06-23 (×2): 30 mL via ORAL
  Filled 2014-06-22 (×2): qty 30

## 2014-06-22 MED ORDER — LORAZEPAM 1 MG PO TABS
1.0000 mg | ORAL_TABLET | Freq: Three times a day (TID) | ORAL | Status: DC | PRN
  Administered 2014-06-23 – 2014-06-25 (×7): 1 mg via ORAL
  Filled 2014-06-22 (×8): qty 1

## 2014-06-22 NOTE — ED Provider Notes (Addendum)
CSN: [id number]     Arrival date & time 06/22/14  1330 History   First MD Initiated Contact with Patient 06/22/14 1456     Chief Complaint  Patient presents with  . Aggressive Behavior   Level V caveat for dementia  (Consider location/radiation/quality/duration/timing/severity/associated sxs/prior Treatment) HPI Wife states patient reportedly told his facility that he had fallen on 1020. The fall was not witnessed. He was brought to the ED for complaints of pain in his chest. He had a normal chest x-ray done. Wife reports since that time he gets intermittent episodes of pain that lasts for a couple minutes at which point patient gets very angry and aggressive. Although she does admit he had anger issues before and had to be placed in a memory units about 6 months ago she feels like his anger issues now are related to the pain that he apparently has. She states sometimes it seems to hurt when he breathes. He has had a normal appetite. She states the pain seems to last a minute. He has not had nausea or vomiting or fever. He has only been getting Tylenol for pain.   PCP Dr Azucena CecilSwayne  Past Medical History  Diagnosis Date  . Alzheimer's dementia     for 10 years  . Right inguinal hernia   . Gross hematuria   . Elevated PSA 2001; 2003    bx negative  . History of nephrolithiasis   . History of esophageal reflux   . Enlarged prostate   . Hyperlipidemia   . GERD (gastroesophageal reflux disease)   . Blood in urine   . Abdominal pain   . Constipation   . Easy bruising   . Blood in stool   . Inguinal hernia   . Shortness of breath 03-11-12    with exertion   Past Surgical History  Procedure Laterality Date  . Cholecystectomy  07/2004  . Inguinal hernia repair  03/17/2012    Procedure: HERNIA REPAIR INGUINAL ADULT;  Surgeon: Adolph Pollackodd J Rosenbower, MD;  Location: [facility];  Service: General;  Laterality: Right;  . Hernia repair  03/17/12    RIH   Family History  Problem Relation Age of Onset   . Cancer Mother     breast  . Heart disease Mother   . Stroke Father    History  Substance Use Topics  . Smoking status: Never Smoker   . Smokeless tobacco: Never Used  . Alcohol Use: No  lives in an alzheimer's unit  Review of Systems  All other systems reviewed and are negative.     Allergies  Review of patient's allergies indicates no known allergies.  Home Medications   Prior to Admission medications   Medication Sig Start Date End Date Taking? Authorizing Provider  acetaminophen (TYLENOL) 500 MG tablet Take 500 mg by mouth every 6 (six) hours as needed for mild pain.   Yes Historical Provider, MD  aspirin EC 81 MG tablet Take 81 mg by mouth daily with breakfast.    Yes Historical Provider, MD  bimatoprost (LUMIGAN) 0.01 % SOLN Place 1 drop into both eyes at bedtime.   Yes Historical Provider, MD  finasteride (PROSCAR) 5 MG tablet Take 5 mg by mouth daily with breakfast.    Yes Historical Provider, MD   BP 123/53  Pulse 79  Temp(Src) 97.5 F (36.4 C) (Oral)  Resp 16  SpO2 96%  Vital signs normal   Physical Exam  Nursing note and vitals reviewed. Constitutional: He appears well-developed  and well-nourished.  Non-toxic appearance. He does not appear ill. No distress.  HENT:  Head: Normocephalic and atraumatic.  Right Ear: External ear normal.  Left Ear: External ear normal.  Nose: Nose normal. No mucosal edema or rhinorrhea.  Mouth/Throat: Oropharynx is clear and moist and mucous membranes are normal. No dental abscesses or uvula swelling.  Eyes: Conjunctivae and EOM are normal. Pupils are equal, round, and reactive to light.  Neck: Normal range of motion and full passive range of motion without pain. Neck supple.  Cardiovascular: Normal rate, regular rhythm and normal heart sounds.  Exam reveals no gallop and no friction rub.   No murmur heard. Pulmonary/Chest: Effort normal and breath sounds normal. No respiratory distress. He has no wheezes. He has no  rhonchi. He has no rales. He exhibits no tenderness and no crepitus.  He points to his left lower rib cage as to where his pain is located. There's no bruising or swelling seen. She states her was a knot in the area earlier in the week. His abdomen soft and nontender.  Abdominal: Soft. Normal appearance and bowel sounds are normal. He exhibits no distension. There is no tenderness. There is no rebound and no guarding.  Musculoskeletal: Normal range of motion. He exhibits no edema and no tenderness.  Moves all extremities well.   Neurological: He is alert. He has normal strength. No cranial nerve deficit.  Patient is confused.  Skin: Skin is warm, dry and intact. No rash noted. No erythema. No pallor.  Psychiatric: He has a normal mood and affect. His speech is normal and behavior is normal. His mood appears not anxious.    ED Course  Procedures (including critical care time)  Medications  LORazepam (ATIVAN) tablet 1 mg (not administered)  acetaminophen (TYLENOL) tablet 650 mg (not administered)  ibuprofen (ADVIL,MOTRIN) tablet 600 mg (not administered)  zolpidem (AMBIEN) tablet 10 mg (not administered)  nicotine (NICODERM CQ - dosed in mg/24 hours) patch 21 mg (21 mg Transdermal Not Given 06/22/14 1851)  ondansetron (ZOFRAN) tablet 4 mg (not administered)  alum & mag hydroxide-simeth (MAALOX/MYLANTA) 200-200-20 MG/5ML suspension 30 mL (not administered)  aspirin EC tablet 81 mg (not administered)  latanoprost (XALATAN) 0.005 % ophthalmic solution 1 drop (not administered)  finasteride (PROSCAR) tablet 5 mg (not administered)   18:30 Wife now states his aggression is getting worse despite telling me before that it was related to his pain. He will now need a psychiatric evaluation. She states Dr Azucena Cecil won't see him any more and he is never at the [facility] when their physician is there so he is between primary care doctors. He has not been given any medications for his behavioral problems. Psych  labs and tests ordered.   20:03 Pt waiting for TSS evaluation.   21:45 Marcus, TSS has evaluated patient and talked to the wife. He has talked to the psych NP and she recommended a one time dose of Seroquel tonight until he can be evaluated by the psychiatrist in the morning .  Labs Review Results for orders placed during the hospital encounter of 06/22/14  CBC WITH DIFFERENTIAL      Result Value Ref Range   WBC 5.6  4.0 - 10.5 K/uL   RBC 4.52  4.22 - 5.81 MIL/uL   Hemoglobin 15.3  13.0 - 17.0 g/dL   HCT 40.9  81.1 - 91.4 %   MCV 97.1  78.0 - 100.0 fL   MCH 33.8  26.0 - 34.0 pg   MCHC  34.9  30.0 - 36.0 g/dL   RDW 82.913.5  56.211.5 - 13.015.5 %   Platelets 121 (*) 150 - 400 K/uL   Neutrophils Relative % 57  43 - 77 %   Neutro Abs 3.3  1.7 - 7.7 K/uL   Lymphocytes Relative 30  12 - 46 %   Lymphs Abs 1.7  0.7 - 4.0 K/uL   Monocytes Relative 10  3 - 12 %   Monocytes Absolute 0.6  0.1 - 1.0 K/uL   Eosinophils Relative 3  0 - 5 %   Eosinophils Absolute 0.1  0.0 - 0.7 K/uL   Basophils Relative 0  0 - 1 %   Basophils Absolute 0.0  0.0 - 0.1 K/uL  COMPREHENSIVE METABOLIC PANEL      Result Value Ref Range   Sodium 140  137 - 147 mEq/L   Potassium 3.8  3.7 - 5.3 mEq/L   Chloride 102  96 - 112 mEq/L   CO2 28  19 - 32 mEq/L   Glucose, Bld 118 (*) 70 - 99 mg/dL   BUN 14  6 - 23 mg/dL   Creatinine, Ser 8.650.76  0.50 - 1.35 mg/dL   Calcium 9.0  8.4 - 78.410.5 mg/dL   Total Protein 6.8  6.0 - 8.3 g/dL   Albumin 3.7  3.5 - 5.2 g/dL   AST 17  0 - 37 U/L   ALT 14  0 - 53 U/L   Alkaline Phosphatase 66  39 - 117 U/L   Total Bilirubin 0.9  0.3 - 1.2 mg/dL   GFR calc non Af Amer 83 (*) >90 mL/min   GFR calc Af Amer >90  >90 mL/min   Anion gap 10  5 - 15  URINALYSIS, ROUTINE W REFLEX MICROSCOPIC      Result Value Ref Range   Color, Urine YELLOW  YELLOW   APPearance CLEAR  CLEAR   Specific Gravity, Urine 1.021  1.005 - 1.030   pH 6.0  5.0 - 8.0   Glucose, UA NEGATIVE  NEGATIVE mg/dL   Hgb urine dipstick  TRACE (*) NEGATIVE   Bilirubin Urine NEGATIVE  NEGATIVE   Ketones, ur NEGATIVE  NEGATIVE mg/dL   Protein, ur NEGATIVE  NEGATIVE mg/dL   Urobilinogen, UA 1.0  0.0 - 1.0 mg/dL   Nitrite NEGATIVE  NEGATIVE   Leukocytes, UA NEGATIVE  NEGATIVE  URINE RAPID DRUG SCREEN (HOSP PERFORMED)      Result Value Ref Range   Opiates NONE DETECTED  NONE DETECTED   Cocaine NONE DETECTED  NONE DETECTED   Benzodiazepines NONE DETECTED  NONE DETECTED   Amphetamines NONE DETECTED  NONE DETECTED   Tetrahydrocannabinol NONE DETECTED  NONE DETECTED   Barbiturates NONE DETECTED  NONE DETECTED  ETHANOL      Result Value Ref Range   Alcohol, Ethyl (B) <11  0 - 11 mg/dL  URINE MICROSCOPIC-ADD ON      Result Value Ref Range   WBC, UA 0-2  <3 WBC/hpf   RBC / HPF 3-6  <3 RBC/hpf   Urine-Other MUCOUS PRESENT      Laboratory interpretation all normal    Imaging Review Dg Ribs Unilateral W/chest Left  06/22/2014   CLINICAL DATA:  Status post fall at nursing facility yesterday. The patient complained of chest pain that started this morning.  EXAM: LEFT RIBS AND CHEST - 3+ VIEW  COMPARISON:  June 19, 2014  FINDINGS: No fracture or other bone lesions are seen involving the ribs.  There is minimal chronic deformity of the lateral left seventh rib unchanged compared to prior exam. There is no evidence of pneumothorax or pleural effusion. There is no focal infiltrate, pulmonary edema, or pleural effusion. There is minimal chronic blunting of the left costophrenic angle. Heart size and mediastinal contours are within normal limits. Surgical clips are identified in the right upper quadrant unchanged.  IMPRESSION: No acute fracture or dislocation. No acute cardiopulmonary disease identified.   Electronically Signed   By: Sherian Rein M.D.   On: 06/22/2014 17:29    Dg Chest 2 View  06/19/2014   CLINICAL DATA:  Chest pain which started yesterday.  Marland Kitchen  IMPRESSION: Low lung volumes with mild vascular crowding and streaky  atelectasis but no infiltrates, edema or effusions.   Electronically Signed   By: Loralie Champagne M.D.   On: 06/19/2014 11:02   Dg Chest 2 View  05/24/2014   CLINICAL DATA:  Cough  e.  IMPRESSION: No active cardiopulmonary disease.   Electronically Signed   By: Natasha Mead M.D.   On: 05/24/2014 14:49       EKG Interpretation None       Date: 06/22/2014  Rate: 65  Rhythm: normal sinus rhythm  QRS Axis: normal  Intervals: normal  ST/T Wave abnormalities: normal  Conduction Disutrbances:none  Narrative Interpretation:   Old EKG Reviewed: none available    MDM   Final diagnoses:  Fall  Acute chest wall pain  Dementia, with behavioral disturbance  Aggressive behavior    Disposition pending  Devoria Albe, MD, Armando Gang     Ward Givens, MD 06/22/14 2004  Ward Givens, MD 06/22/14 2323

## 2014-06-22 NOTE — ED Notes (Signed)
[facility] Expected date:  Expected time:  Means of arrival:  Comments: EMS- elderly, episodes of aggression
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
## 2014-06-22 NOTE — ED Notes (Signed)
Bed: ZO10WA10 Expected date:  Expected time:  Means of arrival:  Comments[facility] Lyn[facility] Doctor[facility]app
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
## 2014-06-23 ENCOUNTER — Encounter (HOSPITAL_COMMUNITY): Payer: Self-pay | Admitting: Psychiatry

## 2014-06-23 DIAGNOSIS — R451 Restlessness and agitation: Secondary | ICD-10-CM

## 2014-06-23 DIAGNOSIS — F0390 Unspecified dementia without behavioral disturbance: Secondary | ICD-10-CM | POA: Diagnosis present

## 2014-06-23 MED ORDER — DIVALPROEX SODIUM 125 MG PO DR TAB
125.0000 mg | DELAYED_RELEASE_TABLET | Freq: Two times a day (BID) | ORAL | Status: DC
  Administered 2014-06-23 – 2014-06-25 (×5): 125 mg via ORAL
  Filled 2014-06-23 (×10): qty 1

## 2014-06-23 MED ORDER — ZOLPIDEM TARTRATE 5 MG PO TABS: 5.0000 mg | ORAL_TABLET | Freq: Every evening | ORAL | Status: DC | PRN

## 2014-06-23 MED ORDER — QUETIAPINE FUMARATE 25 MG PO TABS
25.0000 mg | ORAL_TABLET | Freq: Every day | ORAL | Status: DC
  Administered 2014-06-23 – 2014-06-25 (×3): 25 mg via ORAL
  Filled 2014-06-23 (×4): qty 1
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
## 2014-06-23 NOTE — ED Notes (Signed)
Pt sleeping at this time with even, unlabored resp. Will continue to monitor. Pt wife requests that pt not be disturbed since he did not get any sleep last night. Pt wife provided with sandwich and coffee.

## 2014-06-23 NOTE — ED Notes (Addendum)
Pt's asleep at this time, wife doesn't want to awake pt for his morning meds.

## 2014-06-23 NOTE — ED Notes (Signed)
Patient is complaining of stomach pain.

## 2014-06-23 NOTE — ED Notes (Signed)
Please call pt's daughter Efraim KaufmannMelissa with updates or any changes in pt's status. Her number is [phone]. Thank you

## 2014-06-24 MED ORDER — LORAZEPAM 1 MG PO TABS
1.0000 mg | ORAL_TABLET | Freq: Once | ORAL | Status: AC
  Administered 2014-06-24: 1 mg via ORAL
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
## 2014-06-24 NOTE — ED Notes (Signed)
Pt is awake and alert, pacing the room, standing between the bed rail, attempting to take off clothing. Swinging at staff while trying to assist.Pt is agitated and not redirectable. Safety is monitored and maintained.
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
## 2014-06-24 NOTE — BH Assessment (Signed)
Sp[facility] with [facility] at [facility] who [facility]. Faxed a copy of [facility] Assessment.

## 2014-06-24 NOTE — ED Notes (Signed)
rn spoke with family; wife and son about the pts and his care. No f/u has been confirmed at this time. Son-daniel left his ph # [phone]. He also left daughter Efraim Kaufmannmelissa ph #  At [phone].

## 2014-06-24 NOTE — Progress Notes (Addendum)
CSW [place] with [place] confirmed [place] beds available and faxed referral information for review.     Maryelizabeth Rowanressa Antawn Sison, MSW, LCSWA Evening Clinical Social Worker [phone]

## 2014-06-25 ENCOUNTER — Encounter (HOSPITAL_COMMUNITY): Payer: Self-pay | Admitting: Internal Medicine

## 2014-06-25 ENCOUNTER — Ambulatory Visit (HOSPITAL_COMMUNITY): Payer: Medicare HMO

## 2014-06-25 ENCOUNTER — Observation Stay (HOSPITAL_COMMUNITY): Payer: Medicare HMO

## 2014-06-25 DIAGNOSIS — F0391 Unspecified dementia with behavioral disturbance: Secondary | ICD-10-CM

## 2014-06-25 DIAGNOSIS — N19 Unspecified kidney failure: Secondary | ICD-10-CM | POA: Diagnosis present

## 2014-06-25 DIAGNOSIS — F6089 Other specific personality disorders: Secondary | ICD-10-CM

## 2014-06-25 DIAGNOSIS — F03918 Unspecified dementia, unspecified severity, with other behavioral disturbance: Secondary | ICD-10-CM | POA: Diagnosis present

## 2014-06-25 DIAGNOSIS — N179 Acute kidney failure, unspecified: Secondary | ICD-10-CM

## 2014-06-25 DIAGNOSIS — D696 Thrombocytopenia, unspecified: Secondary | ICD-10-CM

## 2014-06-25 DIAGNOSIS — R109 Unspecified abdominal pain: Secondary | ICD-10-CM | POA: Diagnosis present

## 2014-06-25 HISTORY — DX: Acute kidney failure, unspecified: N17.9

## 2014-06-25 LAB — I-STAT CG4 LACTIC ACID, ED: Lactic Acid, Venous: 1.2 mmol/L (ref 0.5–2.2)

## 2014-06-25 LAB — CBC WITH DIFFERENTIAL/PLATELET
Basophils Absolute: 0 10*3/uL (ref 0.0–0.1)
Basophils Relative: 0 % (ref 0–1)
Eosinophils Absolute: 0.1 10*3/uL (ref 0.0–0.7)
Eosinophils Relative: 2 % (ref 0–5)
HEMATOCRIT: 44.8 % (ref 39.0–52.0)
Hemoglobin: 15.6 g/dL (ref 13.0–17.0)
LYMPHS PCT: 10 % — AB (ref 12–46)
Lymphs Abs: 0.8 10*3/uL (ref 0.7–4.0)
MCH: 33.4 pg (ref 26.0–34.0)
MCHC: 34.8 g/dL (ref 30.0–36.0)
MCV: 95.9 fL (ref 78.0–100.0)
MONO ABS: 0.7 10*3/uL (ref 0.1–1.0)
Monocytes Relative: 9 % (ref 3–12)
Neutro Abs: 5.9 10*3/uL (ref 1.7–7.7)
Neutrophils Relative %: 79 % — ABNORMAL HIGH (ref 43–77)
Platelets: 109 10*3/uL — ABNORMAL LOW (ref 150–400)
RBC: 4.67 MIL/uL (ref 4.22–5.81)
RDW: 13.2 % (ref 11.5–15.5)
WBC: 7.5 10*3/uL (ref 4.0–10.5)

## 2014-06-25 LAB — URINALYSIS, ROUTINE W REFLEX MICROSCOPIC
BILIRUBIN URINE: NEGATIVE
Glucose, UA: NEGATIVE mg/dL
Ketones, ur: NEGATIVE mg/dL
Leukocytes, UA: NEGATIVE
NITRITE: NEGATIVE
Protein, ur: NEGATIVE mg/dL
SPECIFIC GRAVITY, URINE: 1.009 (ref 1.005–1.030)
UROBILINOGEN UA: 0.2 mg/dL (ref 0.0–1.0)
pH: 7 (ref 5.0–8.0)

## 2014-06-25 LAB — COMPREHENSIVE METABOLIC PANEL
ALT: 15 U/L (ref 0–53)
ANION GAP: 14 (ref 5–15)
AST: 19 U/L (ref 0–37)
Albumin: 3.2 g/dL — ABNORMAL LOW (ref 3.5–5.2)
Alkaline Phosphatase: 71 U/L (ref 39–117)
BUN: 36 mg/dL — ABNORMAL HIGH (ref 6–23)
CO2: 23 meq/L (ref 19–32)
CREATININE: 6.51 mg/dL — AB (ref 0.50–1.35)
Calcium: 9 mg/dL (ref 8.4–10.5)
Chloride: 103 mEq/L (ref 96–112)
GFR calc Af Amer: 8 mL/min — ABNORMAL LOW (ref >90)
GFR, EST NON AFRICAN AMERICAN: 7 mL/min — AB (ref >90)
Glucose, Bld: 165 mg/dL — ABNORMAL HIGH (ref 70–99)
Potassium: 4.8 mEq/L (ref 3.7–5.3)
Sodium: 140 mEq/L (ref 137–147)
Total Bilirubin: 0.9 mg/dL (ref 0.3–1.2)
Total Protein: 6.3 g/dL (ref 6.0–8.3)

## 2014-06-25 LAB — CREATININE, URINE, RANDOM: Creatinine, Urine: 65.8 mg/dL

## 2014-06-25 LAB — LIPASE, BLOOD: LIPASE: 20 U/L (ref 11–59)

## 2014-06-25 LAB — SODIUM, URINE, RANDOM: Sodium, Ur: 90 mEq/L

## 2014-06-25 LAB — URINE MICROSCOPIC-ADD ON

## 2014-06-25 MED ORDER — HYDRALAZINE HCL 20 MG/ML IJ SOLN
10.0000 mg | Freq: Four times a day (QID) | INTRAMUSCULAR | Status: DC | PRN
  Administered 2014-06-26: 10 mg via INTRAVENOUS
  Filled 2014-06-25: qty 1

## 2014-06-25 MED ORDER — ONDANSETRON HCL 4 MG PO TABS: 4.0000 mg | ORAL_TABLET | Freq: Four times a day (QID) | ORAL | Status: DC | PRN

## 2014-06-25 MED ORDER — ONDANSETRON HCL 4 MG/2ML IJ SOLN: 4.0000 mg | Freq: Four times a day (QID) | INTRAMUSCULAR | Status: DC | PRN

## 2014-06-25 MED ORDER — DOCUSATE SODIUM 100 MG PO CAPS
100.0000 mg | ORAL_CAPSULE | Freq: Two times a day (BID) | ORAL | Status: DC
  Administered 2014-06-25 – 2014-07-05 (×17): 100 mg via ORAL
  Filled 2014-06-25 (×21): qty 1

## 2014-06-25 MED ORDER — TAMSULOSIN HCL 0.4 MG PO CAPS
0.4000 mg | ORAL_CAPSULE | Freq: Every day | ORAL | Status: DC
  Administered 2014-06-27 – 2014-07-04 (×8): 0.4 mg via ORAL
  Filled 2014-06-25 (×11): qty 1

## 2014-06-25 MED ORDER — ENOXAPARIN SODIUM 40 MG/0.4ML ~~LOC~~ SOLN
40.0000 mg | SUBCUTANEOUS | Status: DC
  Filled 2014-06-25: qty 0.4

## 2014-06-25 MED ORDER — ACETAMINOPHEN 650 MG RE SUPP: 650.0000 mg | Freq: Four times a day (QID) | RECTAL | Status: DC | PRN

## 2014-06-25 MED ORDER — LORAZEPAM 2 MG/ML IJ SOLN
1.0000 mg | Freq: Four times a day (QID) | INTRAMUSCULAR | Status: DC | PRN
  Administered 2014-06-26 (×2): 1 mg via INTRAVENOUS
  Filled 2014-06-25 (×2): qty 1

## 2014-06-25 MED ORDER — SODIUM CHLORIDE 0.9 % IV SOLN
INTRAVENOUS | Status: DC
  Administered 2014-06-25 – 2014-06-27 (×3): via INTRAVENOUS

## 2014-06-25 MED ORDER — ACETAMINOPHEN 325 MG PO TABS
650.0000 mg | ORAL_TABLET | Freq: Four times a day (QID) | ORAL | Status: DC | PRN
  Administered 2014-06-27 – 2014-07-01 (×3): 650 mg via ORAL
  Filled 2014-06-25 (×3): qty 2
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
## 2014-06-25 NOTE — BH Assessment (Signed)
Per Johnathan JohnsKathleen, patient accepted [facility]. The accepting provider is Dr. Guss [facility]. Nursing report # is [phone]. Patient to be transported to [facility] via [facility].

## 2014-06-25 NOTE — BH Assessment (Signed)
Writer informed that patient would be medically admitted. [facility] at [facility]. [facility] staff notified.

## 2014-06-25 NOTE — BH Assessment (Signed)
Dr. Akintayo and Nanine MeansJamison Lord, NP recommend [facility]. [facility] to [facility], [facility], [facility], [facility], [facility], [facility], [facility], [facility].

## 2014-06-25 NOTE — ED Notes (Signed)
Daughter Efraim KaufmannMelissa notified of pt pending admission
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
## 2014-06-25 NOTE — ED Notes (Signed)
Pt transported to CT ?

## 2014-06-25 NOTE — ED Notes (Signed)
Attempted to call report; per secretary nurse at lunch x 20 mins

## 2014-06-25 NOTE — ED Provider Notes (Addendum)
Called to see patient by psychiatry service due to patient having abdominal pain. Patient has dementia and cannot give a history. Staff noted that when he palpated his abdomen that he seemed to be uncomfortable. On my exam he is diffusely tender without peritoneal signs. He is afebrile at this time. Will obtain abdominal CT as well as urinalysis as well as lab tests and follow  2:00 PM Patient with evidence of renal failure at this time. Likely from an enlarged prostate causing a postobstructive process. Will have nursing place a Foley and patient will admitted to the medicine service  Toy BakerAnthony T Doniel Maiello, MD 06/25/14 1154  Toy BakerAnthony T Emmalena Canny, MD 06/25/14 1400

## 2014-06-25 NOTE — ED Notes (Signed)
Pt resting quietly; respirations even and nonlabored;

## 2014-06-25 NOTE — ED Notes (Signed)
Spoke with daughter Malcolm MetroMelissa Pegram [phone]; pt continues to rest quiet; respirations even and nonlabored

## 2014-06-26 DIAGNOSIS — Z87442 Personal history of urinary calculi: Secondary | ICD-10-CM | POA: Diagnosis not present

## 2014-06-26 DIAGNOSIS — N320 Bladder-neck obstruction: Secondary | ICD-10-CM

## 2014-06-26 DIAGNOSIS — Z515 Encounter for palliative care: Secondary | ICD-10-CM | POA: Diagnosis not present

## 2014-06-26 DIAGNOSIS — Z9049 Acquired absence of other specified parts of digestive tract: Secondary | ICD-10-CM | POA: Diagnosis present

## 2014-06-26 DIAGNOSIS — R310 Gross hematuria: Secondary | ICD-10-CM | POA: Diagnosis present

## 2014-06-26 DIAGNOSIS — Z7982 Long term (current) use of aspirin: Secondary | ICD-10-CM | POA: Diagnosis not present

## 2014-06-26 DIAGNOSIS — F0281 Dementia in other diseases classified elsewhere with behavioral disturbance: Secondary | ICD-10-CM | POA: Diagnosis present

## 2014-06-26 DIAGNOSIS — F05 Delirium due to known physiological condition: Secondary | ICD-10-CM | POA: Diagnosis present

## 2014-06-26 DIAGNOSIS — G309 Alzheimer's disease, unspecified: Secondary | ICD-10-CM | POA: Diagnosis present

## 2014-06-26 DIAGNOSIS — R339 Retention of urine, unspecified: Secondary | ICD-10-CM | POA: Diagnosis present

## 2014-06-26 DIAGNOSIS — N401 Enlarged prostate with lower urinary tract symptoms: Secondary | ICD-10-CM | POA: Diagnosis present

## 2014-06-26 DIAGNOSIS — N138 Other obstructive and reflux uropathy: Secondary | ICD-10-CM | POA: Diagnosis present

## 2014-06-26 DIAGNOSIS — E785 Hyperlipidemia, unspecified: Secondary | ICD-10-CM | POA: Diagnosis present

## 2014-06-26 DIAGNOSIS — Z66 Do not resuscitate: Secondary | ICD-10-CM | POA: Diagnosis present

## 2014-06-26 DIAGNOSIS — R319 Hematuria, unspecified: Secondary | ICD-10-CM | POA: Diagnosis present

## 2014-06-26 DIAGNOSIS — K219 Gastro-esophageal reflux disease without esophagitis: Secondary | ICD-10-CM | POA: Diagnosis present

## 2014-06-26 DIAGNOSIS — D696 Thrombocytopenia, unspecified: Secondary | ICD-10-CM | POA: Diagnosis present

## 2014-06-26 DIAGNOSIS — E860 Dehydration: Secondary | ICD-10-CM | POA: Diagnosis present

## 2014-06-26 DIAGNOSIS — N179 Acute kidney failure, unspecified: Secondary | ICD-10-CM | POA: Diagnosis present

## 2014-06-26 DIAGNOSIS — E440 Moderate protein-calorie malnutrition: Secondary | ICD-10-CM | POA: Diagnosis present

## 2014-06-26 LAB — CBC
HEMATOCRIT: 47.3 % (ref 39.0–52.0)
HEMOGLOBIN: 17 g/dL (ref 13.0–17.0)
MCH: 33.9 pg (ref 26.0–34.0)
MCHC: 35.9 g/dL (ref 30.0–36.0)
MCV: 94.4 fL (ref 78.0–100.0)
Platelets: 121 10*3/uL — ABNORMAL LOW (ref 150–400)
RBC: 5.01 MIL/uL (ref 4.22–5.81)
RDW: 12.9 % (ref 11.5–15.5)
WBC: 7.5 10*3/uL (ref 4.0–10.5)

## 2014-06-26 LAB — BASIC METABOLIC PANEL
Anion gap: 13 (ref 5–15)
BUN: 15 mg/dL (ref 6–23)
CHLORIDE: 101 meq/L (ref 96–112)
CO2: 26 meq/L (ref 19–32)
CREATININE: 1.1 mg/dL (ref 0.50–1.35)
Calcium: 9.2 mg/dL (ref 8.4–10.5)
GFR calc non Af Amer: 61 mL/min — ABNORMAL LOW (ref >90)
GFR, EST AFRICAN AMERICAN: 71 mL/min — AB (ref >90)
GLUCOSE: 122 mg/dL — AB (ref 70–99)
POTASSIUM: 4.4 meq/L (ref 3.7–5.3)
Sodium: 140 mEq/L (ref 137–147)

## 2014-06-26 MED ORDER — QUETIAPINE FUMARATE 50 MG PO TABS
50.0000 mg | ORAL_TABLET | Freq: Every day | ORAL | Status: DC
  Administered 2014-06-26 – 2014-06-27 (×2): 50 mg via ORAL
  Filled 2014-06-26 (×3): qty 1

## 2014-06-26 MED ORDER — HALOPERIDOL LACTATE 5 MG/ML IJ SOLN
5.0000 mg | Freq: Four times a day (QID) | INTRAMUSCULAR | Status: DC | PRN
  Administered 2014-06-27 – 2014-07-05 (×4): 5 mg via INTRAVENOUS
  Filled 2014-06-26 (×4): qty 1

## 2014-06-26 MED ORDER — LORAZEPAM 2 MG/ML IJ SOLN
1.0000 mg | INTRAMUSCULAR | Status: DC | PRN
  Administered 2014-06-27 – 2014-07-04 (×13): 1 mg via INTRAVENOUS
  Filled 2014-06-26 (×18): qty 1

## 2014-06-26 MED ORDER — BOOST / RESOURCE BREEZE PO LIQD
1.0000 | Freq: Three times a day (TID) | ORAL | Status: DC
  Administered 2014-06-28 – 2014-07-01 (×7): 1 via ORAL
  Administered 2014-07-01 (×2): via ORAL
  Administered 2014-07-02 – 2014-07-03 (×2): 1 via ORAL

## 2014-06-26 MED ORDER — DIVALPROEX SODIUM 250 MG PO DR TAB
250.0000 mg | DELAYED_RELEASE_TABLET | Freq: Two times a day (BID) | ORAL | Status: DC
  Administered 2014-06-28 – 2014-07-05 (×15): 250 mg via ORAL
  Filled 2014-06-26 (×23): qty 1
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
## 2014-06-26 NOTE — Progress Notes (Signed)
INITIAL NUTRITION ASSESSMENT  DOCUMENTATION CODES Per approved criteria  -Not Applicable   INTERVENTION: Clear liquid diet with diet advancement per MD Resource Breeze tid RD to follow.  NUTRITION DIAGNOSIS: Inadequate oral intake related to dementia as evidenced by documentation.   Goal: Intake of meals and supplements to meet >90% estimated needs.  Monitor:  Intake, labs, weight trend  Reason for Assessment: Consult for assessment of status and needs.  80 y.o. male  Admitting Dx: Agitation  ASSESSMENT: Patient admitted with acute kidney injury.  Patient with a hx of Alzheimer's dementia.  Patient had been living at home until recently secondary to aggressive behavior [facility].  Intake poor since admit.  Height: Ht Readings from Last 1 Encounters:  11/22/12 5\' 8"  (1.727 m)    Weight: Wt Readings from Last 1 Encounters:  11/22/12 154 lb (69.854 kg)    Ideal Body Weight: 154 lbs  % Ideal Body Weight: unknown  Wt Readings from Last 10 Encounters:  11/22/12 154 lb (69.854 kg)  11/22/12 154 lb (69.854 kg)  04/06/12 154 lb 12.8 oz (70.217 kg)  03/11/12 156 lb (70.761 kg)  02/15/12 156 lb 6 oz (70.931 kg)    Usual Body Weight: 154  % Usual Body Weight: unknown  BMI:  Unknown, no recent weight.  Estimated Nutritional Needs: Kcal: 1800-1900 Protein: 70-80 gm Fluid: >1.8L  Skin: intact  Diet Order: Clear Liquid  EDUCATION NEEDS: -No education needs identified at this time   Intake/Output Summary (Last 24 hours) at 06/26/14 1419 Last data filed at 06/26/14 1031  Gross per 24 hour  Intake 1307.5 ml  Output   1625 ml  Net -317.5 ml    Labs:   Recent Labs Lab 06/22/14 1823 06/25/14 1149 06/26/14 0435  NA 140 140 140  K 3.8 4.8 4.4  CL 102 103 101  CO2 28 23 26   BUN 14 36* 15  CREATININE 0.76 6.51* 1.10  CALCIUM 9.0 9.0 9.2  GLUCOSE 118* 165* 122*    CBG (last 3)  No results found for this basename: GLUCAP,   in the last 72 hours  Scheduled Meds: . aspirin EC  81 mg Oral Q breakfast  . divalproex  125 mg Oral Q12H  . docusate sodium  100 mg Oral BID  . finasteride  5 mg Oral Q breakfast  . latanoprost  1 drop Both Eyes QHS  . QUEtiapine  25 mg Oral QHS  . tamsulosin  0.4 mg Oral QPC supper    Continuous Infusions: . sodium chloride 75 mL/hr at 06/25/14 1900    Past Medical History  Diagnosis Date  . Alzheimer's dementia     for 10 years  . Right inguinal hernia   . Gross hematuria   . Elevated PSA 2001; 2003    bx negative  . History of nephrolithiasis   . History of esophageal reflux   . Enlarged prostate   . Hyperlipidemia   . GERD (gastroesophageal reflux disease)   . Blood in urine   . Abdominal pain   . Constipation   . Easy bruising   . Blood in stool   . Inguinal hernia   . Shortness of breath 03-11-12    with exertion  . Acute kidney injury 06/25/2014    Past Surgical History  Procedure Laterality Date  . Cholecystectomy  07/2004  . Inguinal hernia repair  03/17/2012    Procedure: HERNIA REPAIR INGUINAL ADULT;  Surgeon: Adolph Pollackodd J Rosenbower, MD;  Location: [facility];  Service: General;  Laterality: Right;  . Hernia repair  03/17/12    [facility]    Oran ReinLaura Jobe, RD, LDN Clinical Inpatient Dietitian Pager:  [phone] Weekend and after hours pager:  [phone]

## 2014-06-26 NOTE — Progress Notes (Signed)
Patient's daughter Malcolm MetroMelissa Pegram notified of patient's foley catheter issues.  She was also notified that patient had been placed in 5 point restraints.  Melissa was appreciative of phone call and was not upset about what had transpired.  She would like to be notified of any other changes.  Allayne ButcherMiller, [facility]  06/26/2014  3:19 PM

## 2014-06-26 NOTE — Progress Notes (Signed)
Patient's foley catheter has continued to drain without any need for irrigation.  Urine is still bloody at this time.  Will continue to monitor.  Allayne ButcherMiller, [facility]  06/26/2014  7:14 PM

## 2014-06-26 NOTE — Progress Notes (Signed)
A Physician Assistant with urology came to work with foley catheter.  She attempted to irrigate current foley but without success.  She removed foley and attempted to place an 8418 [place] coude catheter but again without success.  She was then able to place an 4518 [place] 3-way foley cathter.  She then irrigated multiple times with saline and got out a few blood clots.  Foley placed to drainage bag and is now draining bloody urine.  Will continue to monitor urine flow.  Patient given bath to cleanse skin from bloody drainage.  Patient now resting much more comfortably.  Johnathan Browning, Johnathan Browning  06/26/2014

## 2014-06-26 NOTE — Progress Notes (Signed)
PROGRESS NOTE  Janett BillowHubert D Greb ZOX:[id number] DOB: Jan 27, 1931 DOA: 06/22/2014 PCP: Sissy HoffSWAYNE,Nashley Cordoba W, MD  Assessment/Plan: AKI -likely a combination of BOO, dehdration vs. ?lab error -Patient was also getting as needed ibuprofen for abdominal pain in the ED. -The patient has pulled on his Foley -improved today -continue IVF Gross hematuria -Patient has pulled on his Foley catheter -Now with gross hematuria -Patient now also retaining urine -Concerned about clots causing obstruction -I have consulted urology -continue finasteride and Flomax -hold ASA Dementia with delirium and aggressive behavior  -Haldol and Ativan for aggitation -restraints for now as pt is trying to pull on foley.  -Has severe baseline dementia.  -Patient has been started on Depakote and seroquel for his behavioral symptoms which I will continue.  -[facility] has [facility] and could likely go there once medically stable  -Reconsulted psychiatry Thrombocytopenia  Monitor closely. Will place on SCDs -Abdominal pain  -Active secondary to bladder outlet obstruction with suprapubic tenderness -Urinalysis without pyuria -Renal ultrasound negative for hydronephrosis -CT abdomen and pelvis negative for hydronephrosis or perinephric stranding protein calorie malnutrition  -Nutrition consult  Family Communication:   Pt at beside Disposition Plan:   Home when medically stable      Procedures/Studies: Dg Chest 2 View  06/19/2014   CLINICAL DATA:  Chest pain which started yesterday.  EXAM: CHEST  2 VIEW  COMPARISON:  05/24/2014  FINDINGS: The heart size and mediastinal contours are within normal limits. Low lung volumes with vascular crowding and streaky atelectasis. Both lungs are otherwise clear. The visualized skeletal structures are unremarkable.  IMPRESSION: Low lung volumes with mild vascular crowding and streaky atelectasis but no infiltrates, edema or effusions.    Electronically Signed   By: Loralie ChampagneMark  Gallerani M.D.   On: 06/19/2014 11:02   Dg Ribs Unilateral W/chest Left  06/22/2014   CLINICAL DATA:  Status post fall at nursing facility yesterday. The patient complained of chest pain that started this morning.  EXAM: LEFT RIBS AND CHEST - 3+ VIEW  COMPARISON:  June 19, 2014  FINDINGS: No fracture or other bone lesions are seen involving the ribs. There is minimal chronic deformity of the lateral left seventh rib unchanged compared to prior exam. There is no evidence of pneumothorax or pleural effusion. There is no focal infiltrate, pulmonary edema, or pleural effusion. There is minimal chronic blunting of the left costophrenic angle. Heart size and mediastinal contours are within normal limits. Surgical clips are identified in the right upper quadrant unchanged.  IMPRESSION: No acute fracture or dislocation. No acute cardiopulmonary disease identified.   Electronically Signed   By: Sherian ReinWei-Chen  Lin M.D.   On: 06/22/2014 17:29   Ct Abdomen Pelvis W Contrast  06/25/2014   CLINICAL DATA:  Patient has dementia and cannot give a history. Staff noted that when he palpated his abdomen that he seemed to be uncomfortable. On my exam he is diffusely tender without peritoneal signs. He is afebrile at this time.  EXAM: CT ABDOMEN AND PELVIS WITHOUT CONTRAST  TECHNIQUE: Multidetector CT imaging of the abdomen and pelvis was performed following the standard protocol without IV contrast.  COMPARISON:  None.  FINDINGS: Bilateral small pleural effusions.  No renal, ureteral, or bladder calculi. No obstructive uropathy. No perinephric stranding is seen. There are bilateral hypodense, fluid attenuating renal mass is most consistent with cysts. The largest right renal interpolar mass measures 4.7 x 3.6 cm. The largest left renal upper  pole mass measures 2.5 x 2.6 cm. The bladder is unremarkable. The prostate gland is enlarged measuring 6.1 x 6.5 cm.  The liver demonstrates no focal  abnormality. The gallbladder is surgically absent. The spleen demonstrates no focal abnormality. The adrenal glands and pancreas are normal.  The unopacified stomach, duodenum, small intestine and large intestine are unremarkable, but evaluation is limited by lack of oral contrast. There is no pneumoperitoneum, pneumatosis, or portal venous gas. There is no abdominal or pelvic free fluid. There is no lymphadenopathy.  The abdominal aorta is normal in caliber with atherosclerosis.  There is degenerative disc disease most severe at L3-4. There is 2 mm of anterolisthesis of L4 on L5 secondary to facet arthropathy. There is severe facet arthropathy at L1-2, L2-3, L3-4, L4-5 and L5-S1.  IMPRESSION: 1. No acute abdominal or pelvic pathology. 2. No nephrolithiasis. 3. Bilateral renal cysts. 4. Lumbar spine spondylosis. 5. Prostatomegaly.  Correlate with PSA.   Electronically Signed   By: Elige Ko   On: 06/25/2014 13:14   US Renal  06/25/2014   CLINICAL DATA:  Obstructive uropathy, elevated BUN and creatinine  EXAM: RENAL/URINARY TRACT ULTRASOUND COMPLETE  COMPARISON:  CT abdomen and pelvis 06/25/2014  FINDINGS: Right Kidney:  Length: 11.2 cm. Normal cortical thickness and echogenicity. Two cysts identified at upper pole measuring 3.5 x 3.1 x 3.7 cm and 2.8 x 2.3 x 2.6 cm. Tiny hypoechoic nodule at inferior pole likely an additional cyst 1.6 x 1.1 x 1.3 cm. No definite solid mass, hydronephrosis or shadowing calcification.  Left Kidney:  Length: 11.5 cm. Normal cortical thickness and echogenicity. Small cyst at inferior pole 2.8 x 2.1 x 2.5 cm. Additional cyst at upper pole 2.3 x 2.3 x 2.8 cm. No definite solid mass, hydronephrosis or shadowing calcification.  Bladder:  Decompressed by Foley catheter, unable to evaluate.  IMPRESSION: BILATERAL renal cysts corresponding to low-attenuation masses identified on CT.  No evidence of solid renal mass or hydronephrosis.   Electronically Signed   By: Ulyses Southward M.D.   On:  06/25/2014 16:49         Subjective:  Patient is agitated and does not follow commands. No respiratory distress, vomiting, diarrhea. The patient pulled on his Foley this morning.  Objective: Filed Vitals:   06/25/14 1443 06/25/14 1609 06/25/14 2106 06/26/14 0615  BP: 150/78 170/98 156/79 98/47  Pulse: 73 76 83 115  Temp: 97.6 F (36.4 C) 97.4 F (36.3 C) 99.2 F (37.3 C)   TempSrc: Oral Oral Axillary   Resp: 20 18 18    SpO2: 100% 98% 96%     Intake/Output Summary (Last 24 hours) at 06/26/14 1050 Last data filed at 06/26/14 1031  Gross per 24 hour  Intake 1307.5 ml  Output   2425 ml  Net -1117.5 ml   Weight change:  Exam:   General:  Pt is alert, does not follow commands appropriately, not in acute distress  HEENT: No icterus, No thrush, [place]/AT  Cardiovascular: RRR, S1/S2, no rubs, no gallops  Respiratory: diminished breath sounds at the bases. No wheezing.   Abdomen: Soft/+BS, non tender, non distended, no guarding  Extremities: No edema, No lymphangitis, No petechiae, No rashes, no synovitis  Data Reviewed: Basic Metabolic Panel:  Recent Labs Lab 06/19/14 1243 06/22/14 1823 06/25/14 1149 06/26/14 0435  NA 140 140 140 140  K 4.0 3.8 4.8 4.4  CL 105 102 103 101  CO2 25 28 23 26   GLUCOSE 123* 118* 165* 122*  BUN 13  14 36* 15  CREATININE 0.80 0.76 6.51* 1.10  CALCIUM 8.7 9.0 9.0 9.2   Liver Function Tests:  Recent Labs Lab 06/22/14 1823 06/25/14 1149  AST 17 19  ALT 14 15  ALKPHOS 66 71  BILITOT 0.9 0.9  PROT 6.8 6.3  ALBUMIN 3.7 3.2*    Recent Labs Lab 06/25/14 1149  LIPASE 20   No results found for this basename: AMMONIA,  in the last 168 hours CBC:  Recent Labs Lab 06/19/14 1243 06/22/14 1823 06/25/14 1149 06/26/14 0435  WBC 6.5 5.6 7.5 7.5  NEUTROABS 4.3 3.3 5.9  --   HGB 14.7 15.3 15.6 17.0  HCT 43.0 43.9 44.8 47.3  MCV 96.4 97.1 95.9 94.4  PLT 121* 121* 109* 121*   Cardiac Enzymes:  Recent Labs Lab  06/19/14 1243  TROPONINI <0.30   BNP: No components found with this basename: POCBNP,  CBG: No results found for this basename: GLUCAP,  in the last 168 hours  No results found for this or any previous visit (from the past 240 hour(s)).   Scheduled Meds: . aspirin EC  81 mg Oral Q breakfast  . divalproex  125 mg Oral Q12H  . docusate sodium  100 mg Oral BID  . finasteride  5 mg Oral Q breakfast  . latanoprost  1 drop Both Eyes QHS  . QUEtiapine  25 mg Oral QHS  . tamsulosin  0.4 mg Oral QPC supper   Continuous Infusions: . sodium chloride 75 mL/hr at 06/25/14 1900     Prentiss Polio, DO  [facility] Pager [phone]  If 7PM-7AM, please contact night-coverage [email or web address] Password [email or web address] 06/26/2014, 10:50 AM   LOS: 4 days

## 2014-06-26 NOTE — H&P (Signed)
I have been asked to see the patient by Dr. Catarina Hartshornavid Tat, MD, for evaluation and management of gross hematuria.  History of present illness: [name] with severe dementia secondary Alzheimer's disease who was initially admitted to the psych unit for aggressive behavior and then was transferred to medicine when he was found to have urinary retention with an elevated creatinine.  A catheter was placed uneventfully and his creatinine quickly returned to baseline.  As part of his dementia he inadvertently removed his foley catheter leading to severe hematuria and inability and clot retention.  The catheter was then removed and ultimately an [name] 3-way catheter was placed.  It was hand irrigated and minimal clots were removed.  The in-flow port of the catheter was plugged and the catheter was left to gravity drainage.  Through out the day the urine color has lightened.  The catheter has not required an additional irrigation due to clots.  Currently the catheter is draining blood tinged/straw colored urine. The patient has a history of BPH and is followed in our practice.  He was being managed with Finasteride for his enlarged prostate.  At his last visit in 2014 he was without complaints and was emptying his bladder without any trouble.  He has no history of  GU surgery, recurrent UTIs or obstructive voiding patterns.  Review of systems: A 12 point comprehensive review of systems was obtained and is negative unless otherwise stated in the history of present illness.  Patient Active Problem List   Diagnosis Date Noted  . Bladder outlet obstruction 06/26/2014  . Acute kidney injury 06/25/2014  . Renal failure 06/25/2014  . Dementia with behavioral disturbance 06/25/2014  . Thrombocytopenia 06/25/2014  . Abdominal pain 06/25/2014  . Agitation 06/23/2014  . Dementia 06/23/2014  . Chest pain 11/22/2012    No current facility-administered medications on file prior to encounter.   Current Outpatient Prescriptions  on File Prior to Encounter  Medication Sig Dispense Refill  . aspirin EC 81 MG tablet Take 81 mg by mouth daily with breakfast.       . bimatoprost (LUMIGAN) 0.01 % SOLN Place 1 drop into both eyes at bedtime.      . finasteride (PROSCAR) 5 MG tablet Take 5 mg by mouth daily with breakfast.         Past Medical History  Diagnosis Date  . Alzheimer's dementia     for 10 years  . Right inguinal hernia   . Gross hematuria   . Elevated PSA 2001; 2003    bx negative  . History of nephrolithiasis   . History of esophageal reflux   . Enlarged prostate   . Hyperlipidemia   . GERD (gastroesophageal reflux disease)   . Blood in urine   . Abdominal pain   . Constipation   . Easy bruising   . Blood in stool   . Inguinal hernia   . Shortness of breath 03-11-12    with exertion  . Acute kidney injury 06/25/2014    Past Surgical History  Procedure Laterality Date  . Cholecystectomy  07/2004  . Inguinal hernia repair  03/17/2012    Procedure: HERNIA REPAIR INGUINAL ADULT;  Surgeon: Adolph Pollackodd J Rosenbower, MD;  Location: [facility];  Service: General;  Laterality: Right;  . Hernia repair  03/17/12    [facility]    History  Substance Use Topics  . Smoking status: Never Smoker   . Smokeless tobacco: Never Used  . Alcohol Use: No    Family History  Problem Relation Age of Onset  . Cancer Mother     breast  . Heart disease Mother   . Stroke Father     PE: Filed Vitals:   06/25/14 2106 06/26/14 0615 06/26/14 1426 06/26/14 1556  BP: 156/79 98/47 119/63 142/93  Pulse: 83 115 84 96  Temp: 99.2 F (37.3 C)  98.2 F (36.8 C) 98.5 F (36.9 C)  TempSrc: Axillary  Oral Axillary  Resp: 18  18 20   SpO2: 96%  97% 100%   Patient appears is confused and disoriented - somnolent. He is wearing soft mittens  Atraumatic normocephalic head No cervical or supraclavicular lymphadenopathy appreciated No increased work of breathing, no audible wheezes/rhonchi Regular sinus rhythm/rate Abdomen is soft,  nontender, nondistended [name] 3-way catheter per urethra draining blood tinged straw colored urine. Lower extremities are symmetric without appreciable edema Grossly neurologically intact No identifiable skin lesions   Recent Labs  06/25/14 1149 06/26/14 0435  WBC 7.5 7.5  HGB 15.6 17.0  HCT 44.8 47.3    Recent Labs  06/25/14 1149 06/26/14 0435  NA 140 140  K 4.8 4.4  CL 103 101  CO2 23 26  GLUCOSE 165* 122*  BUN 36* 15  CREATININE 6.51* 1.10  CALCIUM 9.0 9.2   No results found for this basename: LABPT, INR,  in the last 72 hours No results found for this basename: LABURIN,  in the last 72 hours Results for orders placed during the hospital encounter of 03/11/12  SURGICAL PCR SCREEN     Status: Abnormal   Collection Time    03/11/12  1:50 PM      Result Value Ref Range Status   MRSA, PCR NEGATIVE  NEGATIVE Final   Staphylococcus aureus POSITIVE (*) NEGATIVE Final   Comment:            The Xpert SA Assay (FDA     approved for NASAL specimens     only), is one component of     a comprehensive surveillance     program.  It is not intended     to diagnose infection nor to     guide or monitor treatment.    Imaging: CT ab/pelv - no evidence of hydronephrosis or obstructive uropathy  Imp: Patient with severe dementia and acute rise in creatinine for reasons that are unclear inadvertently yanked on his catheter resulting in prostatic bleeding.  His bleeding has improved significantly once cathter was replaced.    Etiology of acute renal failure unclear - ? Medication related? Bilateral simple cysts - no follow-up necessary.  Recommendations: Leave foley to gravity for next 3-5 days and then remove.  Would consider starting Tamsulosin to ensure that he is optimized for his voiding trial.  Continue finasteride.  Thank you for involving me in this patient's care, please page with any further questions or concerns. [name], [name]
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
## 2014-06-27 DIAGNOSIS — R109 Unspecified abdominal pain: Secondary | ICD-10-CM

## 2014-06-27 LAB — BASIC METABOLIC PANEL
Anion gap: 12 (ref 5–15)
BUN: 16 mg/dL (ref 6–23)
CALCIUM: 9.1 mg/dL (ref 8.4–10.5)
CHLORIDE: 109 meq/L (ref 96–112)
CO2: 24 mEq/L (ref 19–32)
CREATININE: 0.76 mg/dL (ref 0.50–1.35)
GFR calc non Af Amer: 83 mL/min — ABNORMAL LOW (ref >90)
Glucose, Bld: 103 mg/dL — ABNORMAL HIGH (ref 70–99)
Potassium: 4.9 mEq/L (ref 3.7–5.3)
Sodium: 145 mEq/L (ref 137–147)

## 2014-06-27 LAB — CBC
HEMATOCRIT: 45 % (ref 39.0–52.0)
Hemoglobin: 15.4 g/dL (ref 13.0–17.0)
MCH: 32.8 pg (ref 26.0–34.0)
MCHC: 34.2 g/dL (ref 30.0–36.0)
MCV: 95.7 fL (ref 78.0–100.0)
Platelets: 126 10*3/uL — ABNORMAL LOW (ref 150–400)
RBC: 4.7 MIL/uL (ref 4.22–5.81)
RDW: 13.1 % (ref 11.5–15.5)
WBC: 6.6 10*3/uL (ref 4.0–10.5)

## 2014-06-27 MED ORDER — TAMSULOSIN HCL 0.4 MG PO CAPS: 0.4000 mg | ORAL_CAPSULE | Freq: Every day | ORAL | Status: AC

## 2014-06-27 MED ORDER — DIVALPROEX SODIUM 250 MG PO DR TAB: 250.0000 mg | DELAYED_RELEASE_TABLET | Freq: Two times a day (BID) | ORAL | Status: AC

## 2014-06-27 MED ORDER — HALOPERIDOL LACTATE 5 MG/ML IJ SOLN
5.0000 mg | Freq: Once | INTRAMUSCULAR | Status: AC
  Administered 2014-06-27: 5 mg via INTRAVENOUS
  Filled 2014-06-27: qty 1

## 2014-06-27 MED ORDER — BOOST / RESOURCE BREEZE PO LIQD: 1.0000 | Freq: Three times a day (TID) | ORAL | Status: AC

## 2014-06-27 MED ORDER — QUETIAPINE FUMARATE 50 MG PO TABS: 50.0000 mg | ORAL_TABLET | Freq: Every day | ORAL | Status: DC

## 2014-06-27 NOTE — Discharge Summary (Addendum)
Physician Discharge Summary  Johnathan BillowHubert D Antonucci ZOX:[id number] DOB: 1930-10-20 DOA: 06/22/2014  PCP: Sissy HoffSWAYNE,Braysen Cloward W, MD  Admit date: 06/22/2014 Discharge date: 06/27/2014  Recommendations for Outpatient Follow-up:  1. Pt will need to follow up with PCP in 2 weeks post discharge 2. Please obtain BMP and CBC in one week 3. Please remove foley catheter on 07/02/14 for a voiding trial 4. Check depakote level on 06/29/14 and adjust dose as needed  Discharge Diagnoses:  AKI  -likely a combination of BOO, dehdration vs. ?lab error  -Patient was also getting as needed ibuprofen for abdominal pain in the ED.  -The patient has pulled on his Foley  -improved today -Serum creatinine is back to baseline -Serum creatinine 0.76 on the day of discharge  -Patient received IVF, but plan to discontinue as the patient's oral intake improves -Medically stable for transfer to geriatric psychiatric facility Gross hematuria  -Patient has pulled on his Foley catheter  -Now with gross hematuria  -Patient now also retaining urine  -3 were Foley catheter was placed and flushed -I have consulted urology--appreciate  Dr. Jasmine AweHerrick's input-->keep foley catheter in place for next 5 days and then d/c for a voiding trial  -continue finasteride and Flomax  -Hematuria is clearing - medically stable  Dementia with delirium and aggressive behavior  -Haldol and Ativan for aggitation -Patient has not required any doses of when necessary Ativan or Haldol for the last 24 hours  -restraints were initially place as pt was trying to pull on foley.  -Has severe baseline dementia.  -Patient has been started on Depakote and seroquel for his behavioral symptoms which I will continue.  -He has been -accepted at [facility] and could likely go there once medically stable  -Reconsulted psychiatry--discussed with Dr. Elsie SaasJonnalagadda--> Depakote increased to 250 mg twice a day and Seroquel increased to 50 mg at bedtime with  improvement in the patient's aggressive behavior Thrombocytopenia  Monitor closely. Will place on SCDs  -Platelets remained stable -Abdominal pain  -Active secondary to bladder outlet obstruction with suprapubic tenderness  -Urinalysis without pyuria  -Renal ultrasound negative for hydronephrosis  -CT abdomen and pelvis negative for hydronephrosis or perinephric stranding  protein calorie malnutrition  -Nutrition consult  Family Communication: Pt at beside      Discharge Condition: stable  Disposition: geriatric psychiatric facility  Diet:regular Wt Readings from Last 3 Encounters:  11/22/12 69.854 kg (154 lb)  11/22/12 69.854 kg (154 lb)  04/06/12 70.217 kg (154 lb 12.8 oz)    History of present illness:  80 year old male resident of a memory unit with severe Alzheimer's dementia, history of elevated PSA, GERD, hyperlipidemia who has been in the [facility] ED in-stent/23/2015 for aggressive behavior. Patient is from a facility and had a fall on 10/20 . There is no history of loss of consciousness or any injury sustained. As per wife patient since then has been having intermittent episode of aggressive behavior . Patient was seen by psychiatry in the ED and started on Depakote and Seroquel. [facility] Whatever this morning patient complained of severe abdominal pain and ED physician was consulted. As as a part of workup CBC and chemistry was sent and a CT scan of the abdomen and pelvis was done in the ED. He was found to have mild thrombocytopenia with acute kidney injury with BUN of 36 and creatinine of 6.51 (was normal 3 days ago). UA was repeated unremarkable. A CT scan of the abdomen and  pelvis ruled out any acute abdominal or pelvic pathology, renal or ureteral stone but showed enlarged prostate. The patient remained afebrile and hemodynamically stable throughout the hospitalization. The patient was started on intravenous fluids and  a Foley catheter was inserted. The patient's renal failure improved and returned to baseline. Medications were adjusted by psychiatry for the patient's agitation. His agitation improved. The patient did have hematuria secondary to his aggressive behavior and pulling on his Foley catheter. This did gradually resolve. Urology was consulted and recommended keeping the Foley for another 5 days prior to discontinuing it for a voiding trial. They recommended continuing finasteride and Flomax.     Discharge Exam: Filed Vitals:   06/27/14 0553  BP: 147/89  Pulse: 93  Temp: 98.4 F (36.9 C)  Resp: 20   Filed Vitals:   06/26/14 1426 06/26/14 1556 06/26/14 2133 06/27/14 0553  BP: 119/63 142/93 136/79 147/89  Pulse: 84 96 92 93  Temp: 98.2 F (36.8 C) 98.5 F (36.9 C) 98.2 F (36.8 C) 98.4 F (36.9 C)  TempSrc: Oral Axillary  Oral  Resp: 18 20 20 20   SpO2: 97% 100% 98% 99%   General: Alert and awake. NAD, pleasant, cooperative Cardiovascular: RRR, no rub, no gallop, no S3 Respiratory: CTAB, no wheeze, no rhonchi Abdomen:soft, nontender, nondistended, positive bowel sounds Extremities: No edema, No lymphangitis, no petechiae  Discharge Instructions      Discharge Instructions   Diet - low sodium heart healthy    Complete by:  As directed      Increase activity slowly    Complete by:  As directed             Medication List         acetaminophen 500 MG tablet  Commonly known as:  TYLENOL  Take 500 mg by mouth every 6 (six) hours as needed for mild pain.     aspirin EC 81 MG tablet  Take 81 mg by mouth daily with breakfast.     bimatoprost 0.01 % Soln  Commonly known as:  LUMIGAN  Place 1 drop into both eyes at bedtime.     divalproex 250 MG DR tablet  Commonly known as:  DEPAKOTE  Take 1 tablet (250 mg total) by mouth every 12 (twelve) hours.     feeding supplement (RESOURCE BREEZE) Liqd  Take 1 Container by mouth 3 (three) times daily between meals.      finasteride 5 MG tablet  Commonly known as:  PROSCAR  Take 5 mg by mouth daily with breakfast.     QUEtiapine 50 MG tablet  Commonly known as:  SEROQUEL  Take 1 tablet (50 mg total) by mouth at bedtime.     tamsulosin 0.4 MG Caps capsule  Commonly known as:  FLOMAX  Take 1 capsule (0.4 mg total) by mouth daily after supper.         The results of significant diagnostics from this hospitalization (including imaging, microbiology, ancillary and laboratory) are listed below for reference.    Significant Diagnostic Studies: Dg Chest 2 View  06/19/2014   CLINICAL DATA:  Chest pain which started yesterday.  EXAM: CHEST  2 VIEW  COMPARISON:  05/24/2014  FINDINGS: The heart size and mediastinal contours are within normal limits. Low lung volumes with vascular crowding and streaky atelectasis. Both lungs are otherwise clear. The visualized skeletal structures are unremarkable.  IMPRESSION: Low lung volumes with mild vascular crowding and streaky atelectasis but no infiltrates, edema or effusions.   Electronically  Signed   By: Loralie Champagne M.D.   On: 06/19/2014 11:02   Dg Ribs Unilateral W/chest Left  06/22/2014   CLINICAL DATA:  Status post fall at nursing facility yesterday. The patient complained of chest pain that started this morning.  EXAM: LEFT RIBS AND CHEST - 3+ VIEW  COMPARISON:  June 19, 2014  FINDINGS: No fracture or other bone lesions are seen involving the ribs. There is minimal chronic deformity of the lateral left seventh rib unchanged compared to prior exam. There is no evidence of pneumothorax or pleural effusion. There is no focal infiltrate, pulmonary edema, or pleural effusion. There is minimal chronic blunting of the left costophrenic angle. Heart size and mediastinal contours are within normal limits. Surgical clips are identified in the right upper quadrant unchanged.  IMPRESSION: No acute fracture or dislocation. No acute cardiopulmonary disease identified.    Electronically Signed   By: Sherian Rein M.D.   On: 06/22/2014 17:29   Ct Abdomen Pelvis W Contrast  06/25/2014   CLINICAL DATA:  Patient has dementia and cannot give a history. Staff noted that when he palpated his abdomen that he seemed to be uncomfortable. On my exam he is diffusely tender without peritoneal signs. He is afebrile at this time.  EXAM: CT ABDOMEN AND PELVIS WITHOUT CONTRAST  TECHNIQUE: Multidetector CT imaging of the abdomen and pelvis was performed following the standard protocol without IV contrast.  COMPARISON:  None.  FINDINGS: Bilateral small pleural effusions.  No renal, ureteral, or bladder calculi. No obstructive uropathy. No perinephric stranding is seen. There are bilateral hypodense, fluid attenuating renal mass is most consistent with cysts. The largest right renal interpolar mass measures 4.7 x 3.6 cm. The largest left renal upper pole mass measures 2.5 x 2.6 cm. The bladder is unremarkable. The prostate gland is enlarged measuring 6.1 x 6.5 cm.  The liver demonstrates no focal abnormality. The gallbladder is surgically absent. The spleen demonstrates no focal abnormality. The adrenal glands and pancreas are normal.  The unopacified stomach, duodenum, small intestine and large intestine are unremarkable, but evaluation is limited by lack of oral contrast. There is no pneumoperitoneum, pneumatosis, or portal venous gas. There is no abdominal or pelvic free fluid. There is no lymphadenopathy.  The abdominal aorta is normal in caliber with atherosclerosis.  There is degenerative disc disease most severe at L3-4. There is 2 mm of anterolisthesis of L4 on L5 secondary to facet arthropathy. There is severe facet arthropathy at L1-2, L2-3, L3-4, L4-5 and L5-S1.  IMPRESSION: 1. No acute abdominal or pelvic pathology. 2. No nephrolithiasis. 3. Bilateral renal cysts. 4. Lumbar spine spondylosis. 5. Prostatomegaly.  Correlate with PSA.   Electronically Signed   By: Elige Ko   On:  06/25/2014 13:14   US Renal  06/25/2014   CLINICAL DATA:  Obstructive uropathy, elevated BUN and creatinine  EXAM: RENAL/URINARY TRACT ULTRASOUND COMPLETE  COMPARISON:  CT abdomen and pelvis 06/25/2014  FINDINGS: Right Kidney:  Length: 11.2 cm. Normal cortical thickness and echogenicity. Two cysts identified at upper pole measuring 3.5 x 3.1 x 3.7 cm and 2.8 x 2.3 x 2.6 cm. Tiny hypoechoic nodule at inferior pole likely an additional cyst 1.6 x 1.1 x 1.3 cm. No definite solid mass, hydronephrosis or shadowing calcification.  Left Kidney:  Length: 11.5 cm. Normal cortical thickness and echogenicity. Small cyst at inferior pole 2.8 x 2.1 x 2.5 cm. Additional cyst at upper pole 2.3 x 2.3 x 2.8 cm. No definite solid mass,  hydronephrosis or shadowing calcification.  Bladder:  Decompressed by Foley catheter, unable to evaluate.  IMPRESSION: BILATERAL renal cysts corresponding to low-attenuation masses identified on CT.  No evidence of solid renal mass or hydronephrosis.   Electronically Signed   By: Ulyses SouthwardMark  Boles M.D.   On: 06/25/2014 16:49     Microbiology: No results found for this or any previous visit (from the past 240 hour(s)).   Labs: Basic Metabolic Panel:  Recent Labs Lab 06/22/14 1823 06/25/14 1149 06/26/14 0435 06/27/14 0525  NA 140 140 140 145  K 3.8 4.8 4.4 4.9  CL 102 103 101 109  CO2 28 23 26 24   GLUCOSE 118* 165* 122* 103*  BUN 14 36* 15 16  CREATININE 0.76 6.51* 1.10 0.76  CALCIUM 9.0 9.0 9.2 9.1   Liver Function Tests:  Recent Labs Lab 06/22/14 1823 06/25/14 1149  AST 17 19  ALT 14 15  ALKPHOS 66 71  BILITOT 0.9 0.9  PROT 6.8 6.3  ALBUMIN 3.7 3.2*    Recent Labs Lab 06/25/14 1149  LIPASE 20   No results found for this basename: AMMONIA,  in the last 168 hours CBC:  Recent Labs Lab 06/22/14 1823 06/25/14 1149 06/26/14 0435 06/27/14 0525  WBC 5.6 7.5 7.5 6.6  NEUTROABS 3.3 5.9  --   --   HGB 15.3 15.6 17.0 15.4  HCT 43.9 44.8 47.3 45.0  MCV 97.1  95.9 94.4 95.7  PLT 121* 109* 121* 126*   Cardiac Enzymes: No results found for this basename: CKTOTAL, CKMB, CKMBINDEX, TROPONINI,  in the last 168 hours BNP: No components found with this basename: POCBNP,  CBG: No results found for this basename: GLUCAP,  in the last 168 hours  Time coordinating discharge:  Greater than 30 minutes  Signed:  Valmore Arabie, DO [facility] Pager: [phone] 06/27/2014, 10:27 AM

## 2014-06-27 NOTE — Progress Notes (Signed)
Patient has remained out of restraints since 0800 and has tolerated well.  Currently not attempting to get OOB or pulling at any lines/drains.  Will continue to monitor.  Allayne ButcherMiller, Ethelmae Ringel [facility]  06/27/2014  2:53 PM

## 2014-06-27 NOTE — Progress Notes (Signed)
Clinical Social Work  Per MD, patient is medically stable to DC today. CSW updated dtr of DC plans and that CSW would search for hospitals able to accept patient. CSW contacted the following facilities:  [facility]- no available beds  [facility]- [facility]  [facility]- [facility]. Referral faxed.  [facility]- [facility] beds  [facility]- no available beds  [facility]- cannot [facility] due to dementia diagnosis  [facility]- [facility]. Referral faxed.  [facility]- [facility] beds  [facility]- no available beds until this afternoon. Referral faxed.  CSW will continue to follow.  AndersonHolly Tracy Gerken, [place] [phone]

## 2014-06-28 LAB — URINE MICROSCOPIC-ADD ON

## 2014-06-28 LAB — URINALYSIS, ROUTINE W REFLEX MICROSCOPIC
GLUCOSE, UA: NEGATIVE mg/dL
Ketones, ur: 40 mg/dL — AB
Nitrite: POSITIVE — AB
Protein, ur: 100 mg/dL — AB
SPECIFIC GRAVITY, URINE: 1.022 (ref 1.005–1.030)
Urobilinogen, UA: 1 mg/dL (ref 0.0–1.0)
pH: 5.5 (ref 5.0–8.0)

## 2014-06-28 LAB — BASIC METABOLIC PANEL
ANION GAP: 11 (ref 5–15)
BUN: 16 mg/dL (ref 6–23)
CALCIUM: 8.6 mg/dL (ref 8.4–10.5)
CHLORIDE: 104 meq/L (ref 96–112)
CO2: 24 mEq/L (ref 19–32)
CREATININE: 0.78 mg/dL (ref 0.50–1.35)
GFR calc non Af Amer: 82 mL/min — ABNORMAL LOW (ref >90)
Glucose, Bld: 127 mg/dL — ABNORMAL HIGH (ref 70–99)
Potassium: 3.9 mEq/L (ref 3.7–5.3)
Sodium: 139 mEq/L (ref 137–147)

## 2014-06-28 LAB — CBC
HCT: 42.8 % (ref 39.0–52.0)
HEMOGLOBIN: 14.6 g/dL (ref 13.0–17.0)
MCH: 32.7 pg (ref 26.0–34.0)
MCHC: 34.1 g/dL (ref 30.0–36.0)
MCV: 95.7 fL (ref 78.0–100.0)
Platelets: 120 10*3/uL — ABNORMAL LOW (ref 150–400)
RBC: 4.47 MIL/uL (ref 4.22–5.81)
RDW: 13.1 % (ref 11.5–15.5)
WBC: 6.5 10*3/uL (ref 4.0–10.5)

## 2014-06-28 MED ORDER — MEMANTINE HCL 5 MG PO TABS
5.0000 mg | ORAL_TABLET | Freq: Every day | ORAL | Status: DC
Start: 2014-06-28 — End: 2014-07-05
  Administered 2014-06-28 – 2014-07-05 (×8): 5 mg via ORAL
  Filled 2014-06-28 (×8): qty 1

## 2014-06-28 MED ORDER — QUETIAPINE FUMARATE 50 MG PO TABS
50.0000 mg | ORAL_TABLET | Freq: Two times a day (BID) | ORAL | Status: DC
  Administered 2014-06-28 – 2014-07-05 (×14): 50 mg via ORAL
  Filled 2014-06-28 (×15): qty 1

## 2014-06-28 NOTE — Progress Notes (Signed)
PROGRESS NOTE  Johnathan Browning ZOX:[id number] DOB: 09/13/1930 DOA: 06/22/2014 PCP: Sissy Hoff, MD  Assessment/Plan: AKI  -likely a combination of BOO, dehdration vs. ?lab error  -Patient was also getting as needed ibuprofen for abdominal pain in the ED.  -The patient has pulled on his Foley  -improved today  -Serum creatinine is back to baseline  -Serum creatinine 0.78 -Patient received IVF, but plan to discontinue as the patient's oral intake improves  -Medically stable for transfer to geriatric psychiatric facility  Gross hematuria  -Patient has pulled on his Foley catheter  -Now with gross hematuria  -Patient now also retaining urine  -3 way Foley catheter was placed and flushed  -I have consulted urology--appreciate Dr. Jasmine Awe input-->keep foley catheter in place for next 5 days and then d/c for a voiding trial  -continue finasteride and Flomax  -Hematuria is clearing  - medically stable-monitor Hgb--am CBC Dementia with delirium and aggressive behavior  -Haldol and Ativan for aggitation  -Patient has needed rare doses of ativan -restraints were initially place as pt was trying to pull on foley.  -Has severe baseline dementia.  -Patient has been started on Depakote and seroquel for his behavioral symptoms which I will continue.  -awaiting bed at geriatric psych facility -Reconsulted psychiatry--discussed with Dr. Elsie Saas Depakote increased to 250 mg twice a day and Seroquel increased to 50 mg at bedtime with improvement in the patient's aggressive behavior  -10/29--check for  reversible causes of delirium--serum B12, RBC folate, TSH, ammonia -10/29--start low dose namenda and titrate Thrombocytopenia  -chronic Monitor closely. Will place on SCDs  -Platelets remained stable  -Abdominal pain  -Active secondary to bladder outlet obstruction with suprapubic tenderness  -Urinalysis without pyuria  -Renal ultrasound negative for hydronephrosis  -CT  abdomen and pelvis negative for hydronephrosis or perinephric stranding -repeat UA and urine culture  protein calorie malnutrition  -Nutrition consult   Family Communication:  Wife updated at bedside Disposition Plan:   SNF/geripsychiatric facility       Procedures/Studies: Dg Chest 2 View  06/19/2014   CLINICAL DATA:  Chest pain which started yesterday.  EXAM: CHEST  2 VIEW  COMPARISON:  05/24/2014  FINDINGS: The heart size and mediastinal contours are within normal limits. Low lung volumes with vascular crowding and streaky atelectasis. Both lungs are otherwise clear. The visualized skeletal structures are unremarkable.  IMPRESSION: Low lung volumes with mild vascular crowding and streaky atelectasis but no infiltrates, edema or effusions.   Electronically Signed   By: Loralie Champagne M.D.   On: 06/19/2014 11:02   Dg Ribs Unilateral W/chest Left  06/22/2014   CLINICAL DATA:  Status post fall at nursing facility yesterday. The patient complained of chest pain that started this morning.  EXAM: LEFT RIBS AND CHEST - 3+ VIEW  COMPARISON:  June 19, 2014  FINDINGS: No fracture or other bone lesions are seen involving the ribs. There is minimal chronic deformity of the lateral left seventh rib unchanged compared to prior exam. There is no evidence of pneumothorax or pleural effusion. There is no focal infiltrate, pulmonary edema, or pleural effusion. There is minimal chronic blunting of the left costophrenic angle. Heart size and mediastinal contours are within normal limits. Surgical clips are identified in the right upper quadrant unchanged.  IMPRESSION: No acute fracture or dislocation. No acute cardiopulmonary disease identified.   Electronically Signed   By: Sherian Rein M.D.   On: 06/22/2014 17:29   Ct  Abdomen Pelvis W Contrast  06/25/2014   CLINICAL DATA:  Patient has dementia and cannot give a history. Staff noted that when he palpated his abdomen that he seemed to be uncomfortable. On  my exam he is diffusely tender without peritoneal signs. He is afebrile at this time.  EXAM: CT ABDOMEN AND PELVIS WITHOUT CONTRAST  TECHNIQUE: Multidetector CT imaging of the abdomen and pelvis was performed following the standard protocol without IV contrast.  COMPARISON:  None.  FINDINGS: Bilateral small pleural effusions.  No renal, ureteral, or bladder calculi. No obstructive uropathy. No perinephric stranding is seen. There are bilateral hypodense, fluid attenuating renal mass is most consistent with cysts. The largest right renal interpolar mass measures 4.7 x 3.6 cm. The largest left renal upper pole mass measures 2.5 x 2.6 cm. The bladder is unremarkable. The prostate gland is enlarged measuring 6.1 x 6.5 cm.  The liver demonstrates no focal abnormality. The gallbladder is surgically absent. The spleen demonstrates no focal abnormality. The adrenal glands and pancreas are normal.  The unopacified stomach, duodenum, small intestine and large intestine are unremarkable, but evaluation is limited by lack of oral contrast. There is no pneumoperitoneum, pneumatosis, or portal venous gas. There is no abdominal or pelvic free fluid. There is no lymphadenopathy.  The abdominal aorta is normal in caliber with atherosclerosis.  There is degenerative disc disease most severe at L3-4. There is 2 mm of anterolisthesis of L4 on L5 secondary to facet arthropathy. There is severe facet arthropathy at L1-2, L2-3, L3-4, L4-5 and L5-S1.  IMPRESSION: 1. No acute abdominal or pelvic pathology. 2. No nephrolithiasis. 3. Bilateral renal cysts. 4. Lumbar spine spondylosis. 5. Prostatomegaly.  Correlate with PSA.   Electronically Signed   By: Johnathan KoHetal  Browning   On: 06/25/2014 13:14   [place] Renal  06/25/2014   CLINICAL DATA:  Obstructive uropathy, elevated BUN and creatinine  EXAM: RENAL/URINARY TRACT ULTRASOUND COMPLETE  COMPARISON:  CT abdomen and pelvis 06/25/2014  FINDINGS: Right Kidney:  Length: 11.2 cm. Normal cortical thickness  and echogenicity. Two cysts identified at upper pole measuring 3.5 x 3.1 x 3.7 cm and 2.8 x 2.3 x 2.6 cm. Tiny hypoechoic nodule at inferior pole likely an additional cyst 1.6 x 1.1 x 1.3 cm. No definite solid mass, hydronephrosis or shadowing calcification.  Left Kidney:  Length: 11.5 cm. Normal cortical thickness and echogenicity. Small cyst at inferior pole 2.8 x 2.1 x 2.5 cm. Additional cyst at upper pole 2.3 x 2.3 x 2.8 cm. No definite solid mass, hydronephrosis or shadowing calcification.  Bladder:  Decompressed by Foley catheter, unable to evaluate.  IMPRESSION: BILATERAL renal cysts corresponding to low-attenuation masses identified on CT.  No evidence of solid renal mass or hydronephrosis.   Electronically Signed   By: Johnathan SouthwardMark  Browning M.D.   On: 06/25/2014 16:49         Subjective: Patient remains + confused with intermittent episodes of agitation. No reports of vomiting, diarrhea, respiratory distress. He denies any shortness breath, headache, fevers.  Objective: Filed Vitals:   06/27/14 1414 06/27/14 2032 06/28/14 0449 06/28/14 1333  BP: 119/72 147/70 100/74 122/85  Pulse: 97 98 85 79  Temp: 98.5 F (36.9 C) 98.5 F (36.9 C) 97.7 F (36.5 C) 98.1 F (36.7 C)  TempSrc: Oral Oral Axillary Axillary  Resp: 18 16 18 20   SpO2: 100% 97% 98% 99%    Intake/Output Summary (Last 24 hours) at 06/28/14 1818 Last data filed at 06/28/14 1430  Gross per 24 hour  Intake    510 ml  Output    950 ml  Net   -440 ml   Weight change:  Exam:   General:  Pt is alert, does not follow commands appropriately, not in acute distress  HEENT: No icterus, No thrush,  [place]/AT  Cardiovascular: RRR, S1/S2, no rubs, no gallops  Respiratory: CTA bilaterally, no wheezing, no crackles, no rhonchi  Abdomen: Soft/+BS, non tender, non distended, no guarding  Extremities: No edema, No lymphangitis, No petechiae, No rashes, no synovitis  Data Reviewed: Basic Metabolic Panel:  Recent Labs Lab  06/22/14 1823 06/25/14 1149 06/26/14 0435 06/27/14 0525 06/28/14 0450  NA 140 140 140 145 139  K 3.8 4.8 4.4 4.9 3.9  CL 102 103 101 109 104  CO2 28 23 26 24 24   GLUCOSE 118* 165* 122* 103* 127*  BUN 14 36* 15 16 16   CREATININE 0.76 6.51* 1.10 0.76 0.78  CALCIUM 9.0 9.0 9.2 9.1 8.6   Liver Function Tests:  Recent Labs Lab 06/22/14 1823 06/25/14 1149  AST 17 19  ALT 14 15  ALKPHOS 66 71  BILITOT 0.9 0.9  PROT 6.8 6.3  ALBUMIN 3.7 3.2*    Recent Labs Lab 06/25/14 1149  LIPASE 20   No results found for this basename: AMMONIA,  in the last 168 hours CBC:  Recent Labs Lab 06/22/14 1823 06/25/14 1149 06/26/14 0435 06/27/14 0525 06/28/14 0450  WBC 5.6 7.5 7.5 6.6 6.5  NEUTROABS 3.3 5.9  --   --   --   HGB 15.3 15.6 17.0 15.4 14.6  HCT 43.9 44.8 47.3 45.0 42.8  MCV 97.1 95.9 94.4 95.7 95.7  PLT 121* 109* 121* 126* 120*   Cardiac Enzymes: No results found for this basename: CKTOTAL, CKMB, CKMBINDEX, TROPONINI,  in the last 168 hours BNP: No components found with this basename: POCBNP,  CBG: No results found for this basename: GLUCAP,  in the last 168 hours  No results found for this or any previous visit (from the past 240 hour(s)).   Scheduled Meds: . aspirin EC  81 mg Oral Q breakfast  . divalproex  250 mg Oral Q12H  . docusate sodium  100 mg Oral BID  . feeding supplement (RESOURCE BREEZE)  1 Container Oral TID BM  . finasteride  5 mg Oral Q breakfast  . latanoprost  1 drop Both Eyes QHS  . memantine  5 mg Oral Daily  . QUEtiapine  50 mg Oral BID  . tamsulosin  0.4 mg Oral QPC supper   Continuous Infusions:    Fleming Prill, DO  [facility] Pager [phone]  If 7PM-7AM, please contact night-coverage [email or web address] Password [email or web address] 06/28/2014, 6:18 PM   LOS: 6 days

## 2014-06-28 NOTE — Progress Notes (Signed)
Clinical Social Work  CSW rounded with psych MD who spoke with wife at bedside. CSW also spoke with dtr via phone and updated her on DC plans. CSW will continue to follow.  LyonsHolly Kamaya Keckler, [place] [phone]

## 2014-06-28 NOTE — Progress Notes (Addendum)
Clinical Social Work  CSW continues to work on placement for patient. CSW contacted the following facilities:  [facility]- no available beds   [facility]- [facility] beds   [facility]- referral faxed on 10/28. Admissions report patient was denied on 10/29 due to aggression and needing a higher level of care  [facility]- no available beds   [facility]- no available beds   [facility]- cannot accept due to dementia diagnosis   [facility]- referral faxed on 10/28. Admissions reports they have not reviewed information at this time. (Addendum 1600:Spoke with admissions Arline Asp(Cindy) who reports she wanted further clarification and will review information MD and call CSW back)  [facility]- available beds. Referral faxed.  [facility]- referral faxed on 10/28. Admissions report they have not reviewed referral but that will have available beds this afternoon and will alert CSW once they have reviewed information.  (Addendum 1600: Spoke with admissions who reports that patient was denied due to needing higher level of care)  CSW will continue to follow.  BayardHolly Labria Wos, [place] [phone]
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
## 2014-06-29 DIAGNOSIS — R319 Hematuria, unspecified: Secondary | ICD-10-CM

## 2014-06-29 LAB — CBC
HCT: 39.7 % (ref 39.0–52.0)
HEMOGLOBIN: 13.8 g/dL (ref 13.0–17.0)
MCH: 32.7 pg (ref 26.0–34.0)
MCHC: 34.8 g/dL (ref 30.0–36.0)
MCV: 94.1 fL (ref 78.0–100.0)
PLATELETS: 142 10*3/uL — AB (ref 150–400)
RBC: 4.22 MIL/uL (ref 4.22–5.81)
RDW: 12.8 % (ref 11.5–15.5)
WBC: 12.1 10*3/uL — ABNORMAL HIGH (ref 4.0–10.5)

## 2014-06-29 LAB — VALPROIC ACID LEVEL: VALPROIC ACID LVL: 24.2 ug/mL — AB (ref 50.0–100.0)

## 2014-06-29 LAB — BASIC METABOLIC PANEL
Anion gap: 14 (ref 5–15)
BUN: 19 mg/dL (ref 6–23)
CO2: 21 mEq/L (ref 19–32)
Calcium: 8.3 mg/dL — ABNORMAL LOW (ref 8.4–10.5)
Chloride: 107 mEq/L (ref 96–112)
Creatinine, Ser: 0.86 mg/dL (ref 0.50–1.35)
GFR calc Af Amer: >90 mL/min (ref >90)
GFR calc non Af Amer: 79 mL/min — ABNORMAL LOW (ref >90)
GLUCOSE: 175 mg/dL — AB (ref 70–99)
Potassium: 3.7 mEq/L (ref 3.7–5.3)
SODIUM: 142 meq/L (ref 137–147)

## 2014-06-29 LAB — VITAMIN B12: VITAMIN B 12: 367 pg/mL (ref 211–911)

## 2014-06-29 LAB — TSH: TSH: 1.68 u[IU]/mL (ref 0.350–4.500)

## 2014-06-29 LAB — AMMONIA: Ammonia: 36 umol/L (ref 11–60)

## 2014-06-29 LAB — FOLATE RBC: RBC FOLATE: 634 ng/mL — AB (ref >280)

## 2014-06-29 MED ORDER — SODIUM CHLORIDE 0.9 % IV BOLUS (SEPSIS)
500.0000 mL | Freq: Once | INTRAVENOUS | Status: AC
  Administered 2014-06-29: 500 mL via INTRAVENOUS

## 2014-06-29 MED ORDER — SODIUM CHLORIDE 0.9 % IV SOLN
INTRAVENOUS | Status: DC
  Administered 2014-06-29 – 2014-07-04 (×6): via INTRAVENOUS

## 2014-06-29 MED ORDER — DEXTROSE 5 % IV SOLN
1.0000 g | INTRAVENOUS | Status: DC
  Administered 2014-06-29 – 2014-07-01 (×3): 1 g via INTRAVENOUS
  Filled 2014-06-29 (×3): qty 10
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
## 2014-06-29 NOTE — Progress Notes (Signed)
PROGRESS NOTE  Johnathan Browning ZOX:[id number] DOB: 05-25-1931 DOA: 06/22/2014 PCP: Johnathan Browning  Assessment/Plan: AKI  -likely a combination of BOO, dehdration vs. ?lab error  -Patient was also getting as needed ibuprofen for abdominal pain in the ED.  -The patient has pulled on his Foley  -improved today  -Serum creatinine is back to baseline  -Serum creatinine 0.78  -Patient received IVF, but plan to discontinue as the patient's oral intake improves  -Medically stable for transfer to geriatric psychiatric facility  Gross hematuria  -Patient has pulled on his Foley catheter  -Then developed with gross hematuria  -3 way Foley catheter was placed and flushed  -I have consulted urology--appreciate Dr. Jasmine Browning's input-->keep foley catheter in place for next 5 days and then d/c for a voiding trial  -06/29/14--continues to have gross hematuria with clots-->reconsulted Dr. Marlou Browning -continue finasteride and Flomax  - medically stable-monitor Hgb--am CBC  Dementia with delirium and aggressive behavior  -Haldol and Ativan for aggitation  -Patient has needed rare doses of ativan  -restraints were initially place as pt was trying to pull on foley.  -Has severe baseline dementia.  -Patient has been started on Depakote and seroquel for his behavioral symptoms which I will continue.  -awaiting bed at geriatric psych facility  -Reconsulted psychiatry--discussed with Dr. Elsie Browning--> Depakote increased to 250 mg twice a day and Seroquel increased to 50 mg at bedtime with improvement in the patient's aggressive behavior  -10/29--check for reversible causes of delirium-- ----serum B12--pending, RBC folate--pending, TSH--1.680, ammonia--36 -10/29--start low dose namenda and titrate  -try to keep off restraints if possible Thrombocytopenia  -chronic  Monitor closely. Will place on SCDs  -Platelets remained stable  -Suprapubic Abdominal pain  -Active secondary to bladder  outlet obstruction with suprapubic tenderness  -Urinalysis without pyuria  -Renal ultrasound negative for hydronephrosis  -CT abdomen and pelvis negative for hydronephrosis or perinephric stranding  -10/29repeated UA and urine culture  protein calorie malnutrition  -Nutrition consult  Family Communication: Wife updated at bedside on 10/29 Disposition Plan: SNF/geripsychiatric facility          Procedures/Studies: Dg Chest 2 View  06/19/2014   CLINICAL DATA:  Chest pain which started yesterday.  EXAM: CHEST  2 VIEW  COMPARISON:  05/24/2014  FINDINGS: The heart size and mediastinal contours are within normal limits. Low lung volumes with vascular crowding and streaky atelectasis. Both lungs are otherwise clear. The visualized skeletal structures are unremarkable.  IMPRESSION: Low lung volumes with mild vascular crowding and streaky atelectasis but no infiltrates, edema or effusions.   Electronically Signed   By: Johnathan ChampagneMark  Gallerani M.D.   On: 06/19/2014 11:02   Dg Ribs Unilateral W/chest Left  06/22/2014   CLINICAL DATA:  Status post fall at nursing facility yesterday. The patient complained of chest pain that started this morning.  EXAM: LEFT RIBS AND CHEST - 3+ VIEW  COMPARISON:  June 19, 2014  FINDINGS: No fracture or other bone lesions are seen involving the ribs. There is minimal chronic deformity of the lateral left seventh rib unchanged compared to prior exam. There is no evidence of pneumothorax or pleural effusion. There is no focal infiltrate, pulmonary edema, or pleural effusion. There is minimal chronic blunting of the left costophrenic angle. Heart size and mediastinal contours are within normal limits. Surgical clips are identified in the right upper quadrant unchanged.  IMPRESSION: No acute fracture or dislocation. No acute cardiopulmonary disease identified.   Electronically  Signed   By: Johnathan ReinWei-Chen  Lin M.D.   On: 06/22/2014 17:29   Ct Abdomen Pelvis W Contrast  06/25/2014    CLINICAL DATA:  Patient has dementia and cannot give a history. Staff noted that when he palpated his abdomen that he seemed to be uncomfortable. On my exam he is diffusely tender without peritoneal signs. He is afebrile at this time.  EXAM: CT ABDOMEN AND PELVIS WITHOUT CONTRAST  TECHNIQUE: Multidetector CT imaging of the abdomen and pelvis was performed following the standard protocol without IV contrast.  COMPARISON:  None.  FINDINGS: Bilateral small pleural effusions.  No renal, ureteral, or bladder calculi. No obstructive uropathy. No perinephric stranding is seen. There are bilateral hypodense, fluid attenuating renal mass is most consistent with cysts. The largest right renal interpolar mass measures 4.7 x 3.6 cm. The largest left renal upper pole mass measures 2.5 x 2.6 cm. The bladder is unremarkable. The prostate gland is enlarged measuring 6.1 x 6.5 cm.  The liver demonstrates no focal abnormality. The gallbladder is surgically absent. The spleen demonstrates no focal abnormality. The adrenal glands and pancreas are normal.  The unopacified stomach, duodenum, small intestine and large intestine are unremarkable, but evaluation is limited by lack of oral contrast. There is no pneumoperitoneum, pneumatosis, or portal venous gas. There is no abdominal or pelvic free fluid. There is no lymphadenopathy.  The abdominal aorta is normal in caliber with atherosclerosis.  There is degenerative disc disease most severe at L3-4. There is 2 mm of anterolisthesis of L4 on L5 secondary to facet arthropathy. There is severe facet arthropathy at L1-2, L2-3, L3-4, L4-5 and L5-S1.  IMPRESSION: 1. No acute abdominal or pelvic pathology. 2. No nephrolithiasis. 3. Bilateral renal cysts. 4. Lumbar spine spondylosis. 5. Prostatomegaly.  Correlate with PSA.   Electronically Signed   By: Johnathan Browning   On: 06/25/2014 13:14   [place] Renal  06/25/2014   CLINICAL DATA:  Obstructive uropathy, elevated BUN and creatinine  EXAM:  RENAL/URINARY TRACT ULTRASOUND COMPLETE  COMPARISON:  CT abdomen and pelvis 06/25/2014  FINDINGS: Right Kidney:  Length: 11.2 cm. Normal cortical thickness and echogenicity. Two cysts identified at upper pole measuring 3.5 x 3.1 x 3.7 cm and 2.8 x 2.3 x 2.6 cm. Tiny hypoechoic nodule at inferior pole likely an additional cyst 1.6 x 1.1 x 1.3 cm. No definite solid mass, hydronephrosis or shadowing calcification.  Left Kidney:  Length: 11.5 cm. Normal cortical thickness and echogenicity. Small cyst at inferior pole 2.8 x 2.1 x 2.5 cm. Additional cyst at upper pole 2.3 x 2.3 x 2.8 cm. No definite solid mass, hydronephrosis or shadowing calcification.  Bladder:  Decompressed by Foley catheter, unable to evaluate.  IMPRESSION: BILATERAL renal cysts corresponding to low-attenuation masses identified on CT.  No evidence of solid renal mass or hydronephrosis.   Electronically Signed   By: Ulyses SouthwardMark  Boles M.D.   On: 06/25/2014 16:49         Subjective: Presently patient intermittently is agitated but remains pleasantly confused. No reports of vomiting, as per distress, diarrhea  Objective: Filed Vitals:   06/28/14 1333 06/28/14 2102 06/28/14 2148 06/29/14 0612  BP: 122/85 165/77 169/86 165/60  Pulse: 79 115 116 106  Temp: 98.1 F (36.7 C) 98.7 F (37.1 C) 97.8 F (36.6 C) 98.3 F (36.8 C)  TempSrc: Axillary Axillary Oral Oral  Resp: 20 20 20 18   SpO2: 99% 96% 100% 97%    Intake/Output Summary (Last 24 hours) at 06/29/14 0925 Last  data filed at 06/28/14 1820  Gross per 24 hour  Intake    300 ml  Output    650 ml  Net   -350 ml   Weight change:  Exam:   General:  Pt is alert, follows commands appropriately, not in acute distress  HEENT: No icterus, No thrush,  [place]/AT  Cardiovascular: RRR, S1/S2, no rubs, no gallops  Respiratory: poor inspiratory effort but CTA bilaterally, no wheezing, no crackles, no rhonchi  Abdomen: Soft/+BS, suprapubic abdominal pain, non distended, no  guarding  Extremities: No edema, No lymphangitis, No petechiae, No rashes, no synovitis  Data Reviewed: Basic Metabolic Panel:  Recent Labs Lab 06/22/14 1823 06/25/14 1149 06/26/14 0435 06/27/14 0525 06/28/14 0450  NA 140 140 140 145 139  K 3.8 4.8 4.4 4.9 3.9  CL 102 103 101 109 104  CO2 28 23 26 24 24   GLUCOSE 118* 165* 122* 103* 127*  BUN 14 36* 15 16 16   CREATININE 0.76 6.51* 1.10 0.76 0.78  CALCIUM 9.0 9.0 9.2 9.1 8.6   Liver Function Tests:  Recent Labs Lab 06/22/14 1823 06/25/14 1149  AST 17 19  ALT 14 15  ALKPHOS 66 71  BILITOT 0.9 0.9  PROT 6.8 6.3  ALBUMIN 3.7 3.2*    Recent Labs Lab 06/25/14 1149  LIPASE 20    Recent Labs Lab 06/29/14 0420  AMMONIA 36   CBC:  Recent Labs Lab 06/22/14 1823 06/25/14 1149 06/26/14 0435 06/27/14 0525 06/28/14 0450  WBC 5.6 7.5 7.5 6.6 6.5  NEUTROABS 3.3 5.9  --   --   --   HGB 15.3 15.6 17.0 15.4 14.6  HCT 43.9 44.8 47.3 45.0 42.8  MCV 97.1 95.9 94.4 95.7 95.7  PLT 121* 109* 121* 126* 120*   Cardiac Enzymes: No results found for this basename: CKTOTAL, CKMB, CKMBINDEX, TROPONINI,  in the last 168 hours BNP: No components found with this basename: POCBNP,  CBG: No results found for this basename: GLUCAP,  in the last 168 hours  No results found for this or any previous visit (from the past 240 hour(s)).   Scheduled Meds: . aspirin EC  81 mg Oral Q breakfast  . divalproex  250 mg Oral Q12H  . docusate sodium  100 mg Oral BID  . feeding supplement (RESOURCE BREEZE)  1 Container Oral TID BM  . finasteride  5 mg Oral Q breakfast  . latanoprost  1 drop Both Eyes QHS  . memantine  5 mg Oral Daily  . QUEtiapine  50 mg Oral BID  . tamsulosin  0.4 mg Oral QPC supper   Continuous Infusions:    Harvey Matlack, DO  [facility] Pager [phone]  If 7PM-7AM, please contact night-coverage [email or web address] Password [email or web address] 06/29/2014, 9:25 AM   LOS: 7 days

## 2014-06-29 NOTE — Progress Notes (Signed)
Urine output slightly better after flushing catheter, patient appears to be sleeping soundly from 1500-1900, will arouse but right back to sleep, refuses pos and refuses to open eyes

## 2014-06-29 NOTE — Significant Event (Signed)
Foley catheter not draining, very little output since emptied @ 1300, Drainage appears very bloody, no clots visualized. Irrigated with 60cc sterile water per prn order with return of 60cc > now draining pale pink colored urine, foley is secured.

## 2014-06-29 NOTE — Progress Notes (Addendum)
Clinical Social Work  [facility]   [facility]- no available beds but reports CSW can send referral for wait list. Referral faxed.  [facility] auth received from 10/30-11/5 # [id number]. CSW faxed referral and completed phone assessment.  [facility]- [facility] 10[facility]  [facility]   [facility] beds but reports CSW can send referral for wait list. Referral faxed. (Addendum [id number] Denied on 10/30)  [facility]- no available beds   [facility]- cannot accept due to dementia diagnosis   [facility]- admissions Arline Asp(Cindy) reports that they have been taking referrals from their ED but if any beds are available this afternoon then they will contact CSW.  [facility]- admissions Johnathan Browning(Patricia) reports patient was denied due to acuity on 10/30  [facility]- denied on 10/29  CSW will continue to follow.  Unk LightningHolly Norrin Shreffler, [place]CS[place] [phone]   Addendum [phone] Dtr called and CSW gave an update on barriers to DC. Dtr thanked CSW for information. CSW will continue to follow.

## 2014-06-29 NOTE — Consult Note (Signed)
Subjective: 35M with gross hematuria due to traumatic foley.  Now has 31F 3-way.  Has not been on CBI this admission.  Has had persistent blood tinged urine, passing small clots today.  Concern from primary team for leakage around catheter and persistent passage of clots.  Patient with severe dementia - no meaningful interaction.  Anti-infectives: Anti-infectives   None      Current Facility-Administered Medications  Medication Dose Route Frequency Provider Last Rate Last Dose  . acetaminophen (TYLENOL) tablet 650 mg  650 mg Oral Q6H PRN Nishant Dhungel, MD   650 mg at 06/27/14 2114   Or  . acetaminophen (TYLENOL) suppository 650 mg  650 mg Rectal Q6H PRN Nishant Dhungel, MD      . alum & mag hydroxide-simeth (MAALOX/MYLANTA) 200-200-20 MG/5ML suspension 30 mL  30 mL Oral PRN Ward GivensIva L Knapp, MD   30 mL at 06/23/14 1359  . divalproex (DEPAKOTE) DR tablet 250 mg  250 mg Oral Q12H Nehemiah SettleJanardhaha R Jonnalagadda, MD   250 mg at 06/29/14 0847  . docusate sodium (COLACE) capsule 100 mg  100 mg Oral BID Nishant Dhungel, MD   100 mg at 06/28/14 2236  . feeding supplement (RESOURCE BREEZE) (RESOURCE BREEZE) liquid 1 Container  1 Container Oral TID BM Jeoffrey MassedLaura Lee Jobe, RD   1 Container at 06/28/14 2015  . finasteride (PROSCAR) tablet 5 mg  5 mg Oral Q breakfast Ward GivensIva L Knapp, MD   5 mg at 06/29/14 0847  . haloperidol lactate (HALDOL) injection 5 mg  5 mg Intravenous Q6H PRN Catarina Hartshornavid Tat, MD   5 mg at 06/28/14 0411  . hydrALAZINE (APRESOLINE) injection 10 mg  10 mg Intravenous Q6H PRN Nishant Dhungel, MD   10 mg at 06/26/14 0532  . latanoprost (XALATAN) 0.005 % ophthalmic solution 1 drop  1 drop Both Eyes QHS Ward GivensIva L Knapp, MD   1 drop at 06/27/14 2115  . LORazepam (ATIVAN) injection 1 mg  1 mg Intravenous Q4H PRN Catarina Hartshornavid Tat, MD   1 mg at 06/28/14 1426  . memantine (NAMENDA) tablet 5 mg  5 mg Oral Daily Catarina Hartshornavid Tat, MD   5 mg at 06/28/14 1251  . ondansetron (ZOFRAN) tablet 4 mg  4 mg Oral Q6H PRN Nishant Dhungel, MD        Or  . ondansetron (ZOFRAN) injection 4 mg  4 mg Intravenous Q6H PRN Nishant Dhungel, MD      . QUEtiapine (SEROQUEL) tablet 50 mg  50 mg Oral BID Nehemiah SettleJanardhaha R Jonnalagadda, MD   50 mg at 06/28/14 2236  . tamsulosin (FLOMAX) capsule 0.4 mg  0.4 mg Oral QPC supper Nishant Dhungel, MD   0.4 mg at 06/28/14 1817     Objective: Vital signs in last 24 hours: Temp:  [97.8 F (36.6 C)-98.7 F (37.1 C)] 98.1 F (36.7 C) (10/30 0900) Pulse Rate:  [79-116] 113 (10/30 0900) Resp:  [18-20] 18 (10/30 0900) BP: (122-169)/(60-86) 159/84 mmHg (10/30 0900) SpO2:  [96 %-100 %] 96 % (10/30 0900)  Intake/Output from previous day: 10/29 0701 - 10/30 0700 In: 300 [P.O.:240] Out: 650 [Urine:650] Intake/Output this shift: Total I/O In: 360 [P.O.:360] Out: -   Blood around meatus.   31F 3-way foley in place - urine straw colored.  Lab Results:   Recent Labs  06/27/14 0525 06/28/14 0450  WBC 6.6 6.5  HGB 15.4 14.6  HCT 45.0 42.8  PLT 126* 120*   BMET  Recent Labs  06/27/14 0525 06/28/14 0450  NA 145  139  K 4.9 3.9  CL 109 104  CO2 24 24  GLUCOSE 103* 127*  BUN 16 16  CREATININE 0.76 0.78  CALCIUM 9.1 8.6   PT/INR No results found for this basename: LABPROT, INR,  in the last 72 hours ABG No results found for this basename: PHART, PCO2, PO2, HCO3,  in the last 72 hours  Studies/Results: No results found.   Assessment: Catheter was irrigated by hand and straw colored urine returned.  Urine putrid smelling.  Foley may have been airlocked because no clots returned and catheter drained ~500cc immediately.  Patient likely has some clots within bladder that are lysing and causing brown urine.  He also likely has an infection.  He does not appear to have any on-going/active bleeding.  Plan: Would start empiric abx for presumed UTI Remove catheter 5 days from foley trauma Stop ASA for 1-2weeks [place], please page on-call urologist this weekend for  issues/concerns.     LOS: 7 days    Crist FatHERRICK, Shaquina Gillham W 06/29/2014

## 2014-06-30 LAB — BASIC METABOLIC PANEL
ANION GAP: 11 (ref 5–15)
BUN: 18 mg/dL (ref 6–23)
CALCIUM: 8.9 mg/dL (ref 8.4–10.5)
CHLORIDE: 107 meq/L (ref 96–112)
CO2: 24 mEq/L (ref 19–32)
Creatinine, Ser: 0.73 mg/dL (ref 0.50–1.35)
GFR calc Af Amer: >90 mL/min (ref >90)
GFR calc non Af Amer: 85 mL/min — ABNORMAL LOW (ref >90)
Glucose, Bld: 104 mg/dL — ABNORMAL HIGH (ref 70–99)
Potassium: 3.5 mEq/L — ABNORMAL LOW (ref 3.7–5.3)
Sodium: 142 mEq/L (ref 137–147)

## 2014-06-30 LAB — CBC
HCT: 40 % (ref 39.0–52.0)
Hemoglobin: 13.5 g/dL (ref 13.0–17.0)
MCH: 32.7 pg (ref 26.0–34.0)
MCHC: 33.8 g/dL (ref 30.0–36.0)
MCV: 96.9 fL (ref 78.0–100.0)
PLATELETS: 140 10*3/uL — AB (ref 150–400)
RBC: 4.13 MIL/uL — ABNORMAL LOW (ref 4.22–5.81)
RDW: 13.3 % (ref 11.5–15.5)
WBC: 7.4 10*3/uL (ref 4.0–10.5)

## 2014-06-30 LAB — URINE CULTURE
Colony Count: NO GROWTH
Culture: NO GROWTH

## 2014-06-30 MED ORDER — CYANOCOBALAMIN 1000 MCG/ML IJ SOLN
1000.0000 ug | Freq: Every day | INTRAMUSCULAR | Status: DC
  Administered 2014-06-30 – 2014-07-01 (×2): 1000 ug via INTRAMUSCULAR
  Filled 2014-06-30 (×2): qty 1

## 2014-06-30 MED ORDER — POTASSIUM CHLORIDE 10 MEQ/100ML IV SOLN
10.0000 meq | INTRAVENOUS | Status: AC
  Administered 2014-06-30 (×2): 10 meq via INTRAVENOUS
  Filled 2014-06-30 (×2): qty 100

## 2014-06-30 NOTE — Progress Notes (Signed)
PROGRESS NOTE  Johnathan Browning OZH:[id number] DOB: 23-Feb-1931 DOA: 06/22/2014 PCP: Sissy Hoff, MD  Assessment/Plan: AKI  -likely a combination of BOO, dehdration vs. ?lab error  -Patient was also getting as needed ibuprofen for abdominal pain in the ED.  -The patient has pulled on his Foley  -improved  -Serum creatinine is back to baseline  -Serum creatinine 0.73  -continue IVF as he has poor po intake -Medically stable for transfer to geriatric psychiatric facility  Gross hematuria  -Patient has pulled on his Foley catheter  -Then developed with gross hematuria  -3 way Foley catheter was placed and flushed  -I have consulted urology--appreciate Dr. Jasmine Awe input-->keep foley catheter in place for next 5 days and then d/c for a voiding trial  -06/29/14--continues to have gross hematuria with clots-->reconsulted Dr. Anselm Pancoast reflushed-->hematuria now resolved  -continue finasteride and Flomax  - medically stable-monitor Hgb--stable  Dementia with delirium and aggressive behavior  -Haldol and Ativan prn for aggitation  -Patient has needed rare doses of ativan  -Has severe baseline dementia.  -Patient has been started on Depakote and seroquel for his behavioral symptoms-->much less aggressive when not disturbed -awaiting bed at geriatric psych facility  -Reconsulted psychiatry--discussed with Dr. Elsie Saas Depakote increased to 250 mg twice a day and Seroquel increased to 50 mg bid with improvement in the patient's aggressive behavior  -10/29--check for reversible causes of delirium--  ----serum B12--367-->low normal-->start B12 supplementation -RBC folate--634, TSH--1.680, ammonia--36  -10/29--start low dose namenda and titrate  -try to keep off restraints if possible  Goals of care -long discussion with wife today--she gives a history of gradual decline of his dementia since his mid 68s, but has had an acute worsening in the last month with  increasing aggression -Patient is presently DO NOT RESUSCITATE -Wife verbalizes a desire to focus of the quality of pt's life at this point-->consulted palliative medicine-->meeting planned 07/01/14 Thrombocytopenia  -chronic  Monitor closely. Will place on SCDs  -Platelets remained stable  -Suprapubic Abdominal pain  -Active secondary to bladder outlet obstruction with suprapubic tenderness  -Urinalysis without pyuria  -Renal ultrasound negative for hydronephrosis  -CT abdomen and pelvis negative for hydronephrosis or perinephric stranding  -10/29repeated UA and urine culture--neg  protein calorie malnutrition  -Nutrition consult  Family Communication: Wife updated at bedside on 10/31--total time 35 min, >50% spent counseling and coordinating care  Disposition Plan: SNFwith hospice vs. geripsychiatric facility           Procedures/Studies: Dg Chest 2 View  06/19/2014   CLINICAL DATA:  Chest pain which started yesterday.  EXAM: CHEST  2 VIEW  COMPARISON:  05/24/2014  FINDINGS: The heart size and mediastinal contours are within normal limits. Low lung volumes with vascular crowding and streaky atelectasis. Both lungs are otherwise clear. The visualized skeletal structures are unremarkable.  IMPRESSION: Low lung volumes with mild vascular crowding and streaky atelectasis but no infiltrates, edema or effusions.   Electronically Signed   By: Loralie Champagne M.D.   On: 06/19/2014 11:02   Dg Ribs Unilateral W/chest Left  06/22/2014   CLINICAL DATA:  Status post fall at nursing facility yesterday. The patient complained of chest pain that started this morning.  EXAM: LEFT RIBS AND CHEST - 3+ VIEW  COMPARISON:  June 19, 2014  FINDINGS: No fracture or other bone lesions are seen involving the ribs. There is minimal chronic deformity of the lateral left seventh rib unchanged compared to prior exam. There  is no evidence of pneumothorax or pleural effusion. There is no focal infiltrate,  pulmonary edema, or pleural effusion. There is minimal chronic blunting of the left costophrenic angle. Heart size and mediastinal contours are within normal limits. Surgical clips are identified in the right upper quadrant unchanged.  IMPRESSION: No acute fracture or dislocation. No acute cardiopulmonary disease identified.   Electronically Signed   By: Sherian Rein M.D.   On: 06/22/2014 17:29   Ct Abdomen Pelvis W Contrast  06/25/2014   CLINICAL DATA:  Patient has dementia and cannot give a history. Staff noted that when he palpated his abdomen that he seemed to be uncomfortable. On my exam he is diffusely tender without peritoneal signs. He is afebrile at this time.  EXAM: CT ABDOMEN AND PELVIS WITHOUT CONTRAST  TECHNIQUE: Multidetector CT imaging of the abdomen and pelvis was performed following the standard protocol without IV contrast.  COMPARISON:  None.  FINDINGS: Bilateral small pleural effusions.  No renal, ureteral, or bladder calculi. No obstructive uropathy. No perinephric stranding is seen. There are bilateral hypodense, fluid attenuating renal mass is most consistent with cysts. The largest right renal interpolar mass measures 4.7 x 3.6 cm. The largest left renal upper pole mass measures 2.5 x 2.6 cm. The bladder is unremarkable. The prostate gland is enlarged measuring 6.1 x 6.5 cm.  The liver demonstrates no focal abnormality. The gallbladder is surgically absent. The spleen demonstrates no focal abnormality. The adrenal glands and pancreas are normal.  The unopacified stomach, duodenum, small intestine and large intestine are unremarkable, but evaluation is limited by lack of oral contrast. There is no pneumoperitoneum, pneumatosis, or portal venous gas. There is no abdominal or pelvic free fluid. There is no lymphadenopathy.  The abdominal aorta is normal in caliber with atherosclerosis.  There is degenerative disc disease most severe at L3-4. There is 2 mm of anterolisthesis of L4 on L5  secondary to facet arthropathy. There is severe facet arthropathy at L1-2, L2-3, L3-4, L4-5 and L5-S1.  IMPRESSION: 1. No acute abdominal or pelvic pathology. 2. No nephrolithiasis. 3. Bilateral renal cysts. 4. Lumbar spine spondylosis. 5. Prostatomegaly.  Correlate with PSA.   Electronically Signed   By: Elige Ko   On: 06/25/2014 13:14   US Renal  06/25/2014   CLINICAL DATA:  Obstructive uropathy, elevated BUN and creatinine  EXAM: RENAL/URINARY TRACT ULTRASOUND COMPLETE  COMPARISON:  CT abdomen and pelvis 06/25/2014  FINDINGS: Right Kidney:  Length: 11.2 cm. Normal cortical thickness and echogenicity. Two cysts identified at upper pole measuring 3.5 x 3.1 x 3.7 cm and 2.8 x 2.3 x 2.6 cm. Tiny hypoechoic nodule at inferior pole likely an additional cyst 1.6 x 1.1 x 1.3 cm. No definite solid mass, hydronephrosis or shadowing calcification.  Left Kidney:  Length: 11.5 cm. Normal cortical thickness and echogenicity. Small cyst at inferior pole 2.8 x 2.1 x 2.5 cm. Additional cyst at upper pole 2.3 x 2.3 x 2.8 cm. No definite solid mass, hydronephrosis or shadowing calcification.  Bladder:  Decompressed by Foley catheter, unable to evaluate.  IMPRESSION: BILATERAL renal cysts corresponding to low-attenuation masses identified on CT.  No evidence of solid renal mass or hydronephrosis.   Electronically Signed   By: Ulyses Southward M.D.   On: 06/25/2014 16:49         Subjective: Patient has not required any Ativan or Haldol in the past 24 hours, but becomes agitated with minimal examination. No reports of vomiting, respiratory distress, diarrhea  Objective: [employer]  Vitals:   06/29/14 1445 06/29/14 1500 06/29/14 2204 06/30/14 0504  BP: 88/56 106/68 126/60 125/61  Pulse: 108  93 88  Temp: 98.4 F (36.9 C)  98.4 F (36.9 C) 97.8 F (36.6 C)  TempSrc: Axillary  Axillary Axillary  Resp: 20  18 20   SpO2: 97%  98% 98%    Intake/Output Summary (Last 24 hours) at 06/30/14 1457 Last data filed at 06/30/14  0811  Gross per 24 hour  Intake 1536.25 ml  Output    525 ml  Net 1011.25 ml   Weight change:  Exam:   General:  Pt is alert, does not follow commands appropriately, not in acute distress  HEENT: No icterus, No thrush,  [place]/AT  Cardiovascular: RRR, S1/S2, no rubs, no gallops  Respiratory: Diminished breath sounds but clear to auscultation  Abdomen: Soft/+BS, non tender, non distended, no guarding  Extremities: No edema, No lymphangitis, No petechiae, No rashes, no synovitis  Data Reviewed: Basic Metabolic Panel:  Recent Labs Lab 06/26/14 0435 06/27/14 0525 06/28/14 0450 06/29/14 1535 06/30/14 0530  NA 140 145 139 142 142  K 4.4 4.9 3.9 3.7 3.5*  CL 101 109 104 107 107  CO2 26 24 24 21 24   GLUCOSE 122* 103* 127* 175* 104*  BUN 15 16 16 19 18   CREATININE 1.10 0.76 0.78 0.86 0.73  CALCIUM 9.2 9.1 8.6 8.3* 8.9   Liver Function Tests:  Recent Labs Lab 06/25/14 1149  AST 19  ALT 15  ALKPHOS 71  BILITOT 0.9  PROT 6.3  ALBUMIN 3.2*    Recent Labs Lab 06/25/14 1149  LIPASE 20    Recent Labs Lab 06/29/14 0420  AMMONIA 36   CBC:  Recent Labs Lab 06/25/14 1149 06/26/14 0435 06/27/14 0525 06/28/14 0450 06/29/14 1535 06/30/14 0530  WBC 7.5 7.5 6.6 6.5 12.1* 7.4  NEUTROABS 5.9  --   --   --   --   --   HGB 15.6 17.0 15.4 14.6 13.8 13.5  HCT 44.8 47.3 45.0 42.8 39.7 40.0  MCV 95.9 94.4 95.7 95.7 94.1 96.9  PLT 109* 121* 126* 120* 142* 140*   Cardiac Enzymes: No results found for this basename: CKTOTAL, CKMB, CKMBINDEX, TROPONINI,  in the last 168 hours BNP: No components found with this basename: POCBNP,  CBG: No results found for this basename: GLUCAP,  in the last 168 hours  Recent Results (from the past 240 hour(s))  URINE CULTURE     Status: None   Collection Time    06/28/14  7:45 PM      Result Value Ref Range Status   Specimen Description URINE, RANDOM   Final   Special Requests NONE   Final   Culture  Setup Time     Final    Value: 06/29/2014 00:21     Performed at [facility]   Colony Count     Final   Value: NO GROWTH     Performed at [facility]   Culture     Final   Value: NO GROWTH     Performed at [facility]   Report Status 06/30/2014 FINAL   Final     Scheduled Meds: . cefTRIAXone (ROCEPHIN)  IV  1 g Intravenous Q24H  . divalproex  250 mg Oral Q12H  . docusate sodium  100 mg Oral BID  . feeding supplement (RESOURCE BREEZE)  1 Container Oral TID BM  . finasteride  5 mg Oral Q breakfast  .  latanoprost  1 drop Both Eyes QHS  . memantine  5 mg Oral Daily  . QUEtiapine  50 mg Oral BID  . tamsulosin  0.4 mg Oral QPC supper   Continuous Infusions: . sodium chloride 75 mL/hr at 06/30/14 1135     Dashia Caldeira, DO  [facility] Pager [phone]  If 7PM-7AM, please contact night-coverage [email or web address] Password [email or web address] 06/30/2014, 2:57 PM   LOS: 8 days

## 2014-06-30 NOTE — Progress Notes (Signed)
Patient ZO:[id number]:Johnathan Deanna ArtisD Tayloe      DOB: 1931-05-04      VWU:[id number]  I spoke with Dr Tat who has had discussions about goals with family. Further questions remain, specifically around potential hospice care.  I talked with wife Johnathan Browning briefly by phone as patient unable to have any conversations. I will meet her around 10AM tomorrow.   Orvis BrillAaron J. Alie Hardgrove D.O. Palliative Medicine Team at [facility]  Pager: [phone] Team Phone: [phone]

## 2014-07-01 DIAGNOSIS — Z515 Encounter for palliative care: Secondary | ICD-10-CM

## 2014-07-01 LAB — BASIC METABOLIC PANEL
ANION GAP: 8 (ref 5–15)
BUN: 14 mg/dL (ref 6–23)
CALCIUM: 8.7 mg/dL (ref 8.4–10.5)
CO2: 25 mEq/L (ref 19–32)
Chloride: 110 mEq/L (ref 96–112)
Creatinine, Ser: 0.77 mg/dL (ref 0.50–1.35)
GFR, EST NON AFRICAN AMERICAN: 83 mL/min — AB (ref >90)
Glucose, Bld: 99 mg/dL (ref 70–99)
POTASSIUM: 4.1 meq/L (ref 3.7–5.3)
SODIUM: 143 meq/L (ref 137–147)
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
## 2014-07-01 NOTE — Plan of Care (Signed)
Problem: Phase I Progression Outcomes Goal: OOB as tolerated unless otherwise ordered Outcome: Not Progressing  Comments:  Pt was in bed all day. Pt refused to dangle.

## 2014-07-01 NOTE — Progress Notes (Signed)
PROGRESS NOTE  Johnathan Browning OZH:[id number] DOB: September 01, 1930 DOA: 06/22/2014 PCP: Sissy Hoff, MD  Assessment/Plan: AKI  -likely a combination of BOO, dehdration vs. ?lab error  -Patient was also getting as needed ibuprofen for abdominal pain in the ED.  -The patient has pulled on his Foley  -improved  -Serum creatinine is back to baseline  -Serum creatinine 0.73  -continue IVF as he has poor po intake -Medically stable for transfer to geriatric psychiatric facility  Gross hematuria  -Patient has pulled on his Foley catheter  -Then developed with gross hematuria  -3 way Foley catheter was placed and flushed  -I have consulted urology--appreciate Dr. Jasmine Awe input-->keep foley catheter in place for next 5 days and then d/c for a voiding trial  -06/29/14--continues to have gross hematuria with clots-->reconsulted Dr. Anselm Pancoast reflushed-->hematuria now resolved  -continue finasteride and Flomax  - medically stable-monitor Hgb--stable  Dementia with delirium and aggressive behavior  -Haldol and Ativan prn for aggitation  -Patient has needed rare doses of ativan  -Has severe baseline dementia.  -Patient has been started on Depakote and seroquel for his behavioral symptoms-->much less aggressive when not disturbed -awaiting bed at geriatric psych facility  -Reconsulted psychiatry--discussed with Dr. Elsie Saas Depakote increased to 250 mg twice a day and Seroquel increased to 50 mg bid with improvement in the patient's aggressive behavior  -10/29--check for reversible causes of delirium--  ----serum B12--367-->low normal-->start B12 supplementation -RBC folate--634, TSH--1.680, ammonia--36  -10/29--start low dose namenda and titrate  -try to keep off restraints if possible  Goals of care -long discussion with wife today--she gives a history of gradual decline of his dementia since his mid 49s, but has had an acute worsening in  the last month with increasing aggression -Patient is presently DO NOT RESUSCITATE -Wife verbalizes a desire to focus of the quality of pt's life at this point-->consulted palliative medicine -appreciate Dr. Towana Badger more labs or xrays-->no rehospitalization after d/c-->focus on comfort Thrombocytopenia  -chronic  Monitor closely. Will place on SCDs  -Platelets remained stable  -Suprapubic Abdominal pain  -Active secondary to bladder outlet obstruction with suprapubic tenderness  -Urinalysis without pyuria  -Renal ultrasound negative for hydronephrosis  -CT abdomen and pelvis negative for hydronephrosis or perinephric stranding  -10/29repeated UA and urine culture--neg  protein calorie malnutrition  -Nutrition consult  Family Communication: Wife updated at bedside   Disposition Plan: SNFwith hospice         Procedures/Studies: Dg Chest 2 View  06/19/2014   CLINICAL DATA:  Chest pain which started yesterday.  EXAM: CHEST  2 VIEW  COMPARISON:  05/24/2014  FINDINGS: The heart size and mediastinal contours are within normal limits. Low lung volumes with vascular crowding and streaky atelectasis. Both lungs are otherwise clear. The visualized skeletal structures are unremarkable.  IMPRESSION: Low lung volumes with mild vascular crowding and streaky atelectasis but no infiltrates, edema or effusions.   Electronically Signed   By: Loralie Champagne M.D.   On: 06/19/2014 11:02   Dg Ribs Unilateral W/chest Left  06/22/2014   CLINICAL DATA:  Status post fall at nursing facility yesterday. The patient complained of chest pain that started this morning.  EXAM: LEFT RIBS AND CHEST - 3+ VIEW  COMPARISON:  June 19, 2014  FINDINGS: No fracture or other bone lesions are seen involving the ribs. There is minimal chronic deformity of the lateral left seventh rib unchanged compared to prior exam. There is no evidence of pneumothorax  or pleural effusion. There is no focal infiltrate,  pulmonary edema, or pleural effusion. There is minimal chronic blunting of the left costophrenic angle. Heart size and mediastinal contours are within normal limits. Surgical clips are identified in the right upper quadrant unchanged.  IMPRESSION: No acute fracture or dislocation. No acute cardiopulmonary disease identified.   Electronically Signed   By: Sherian Rein M.D.   On: 06/22/2014 17:29   Ct Abdomen Pelvis W Contrast  06/25/2014   CLINICAL DATA:  Patient has dementia and cannot give a history. Staff noted that when he palpated his abdomen that he seemed to be uncomfortable. On my exam he is diffusely tender without peritoneal signs. He is afebrile at this time.  EXAM: CT ABDOMEN AND PELVIS WITHOUT CONTRAST  TECHNIQUE: Multidetector CT imaging of the abdomen and pelvis was performed following the standard protocol without IV contrast.  COMPARISON:  None.  FINDINGS: Bilateral small pleural effusions.  No renal, ureteral, or bladder calculi. No obstructive uropathy. No perinephric stranding is seen. There are bilateral hypodense, fluid attenuating renal mass is most consistent with cysts. The largest right renal interpolar mass measures 4.7 x 3.6 cm. The largest left renal upper pole mass measures 2.5 x 2.6 cm. The bladder is unremarkable. The prostate gland is enlarged measuring 6.1 x 6.5 cm.  The liver demonstrates no focal abnormality. The gallbladder is surgically absent. The spleen demonstrates no focal abnormality. The adrenal glands and pancreas are normal.  The unopacified stomach, duodenum, small intestine and large intestine are unremarkable, but evaluation is limited by lack of oral contrast. There is no pneumoperitoneum, pneumatosis, or portal venous gas. There is no abdominal or pelvic free fluid. There is no lymphadenopathy.  The abdominal aorta is normal in caliber with atherosclerosis.  There is degenerative disc disease most severe at L3-4. There is 2 mm of anterolisthesis of L4 on L5  secondary to facet arthropathy. There is severe facet arthropathy at L1-2, L2-3, L3-4, L4-5 and L5-S1.  IMPRESSION: 1. No acute abdominal or pelvic pathology. 2. No nephrolithiasis. 3. Bilateral renal cysts. 4. Lumbar spine spondylosis. 5. Prostatomegaly.  Correlate with PSA.   Electronically Signed   By: Elige Ko   On: 06/25/2014 13:14   US Renal  06/25/2014   CLINICAL DATA:  Obstructive uropathy, elevated BUN and creatinine  EXAM: RENAL/URINARY TRACT ULTRASOUND COMPLETE  COMPARISON:  CT abdomen and pelvis 06/25/2014  FINDINGS: Right Kidney:  Length: 11.2 cm. Normal cortical thickness and echogenicity. Two cysts identified at upper pole measuring 3.5 x 3.1 x 3.7 cm and 2.8 x 2.3 x 2.6 cm. Tiny hypoechoic nodule at inferior pole likely an additional cyst 1.6 x 1.1 x 1.3 cm. No definite solid mass, hydronephrosis or shadowing calcification.  Left Kidney:  Length: 11.5 cm. Normal cortical thickness and echogenicity. Small cyst at inferior pole 2.8 x 2.1 x 2.5 cm. Additional cyst at upper pole 2.3 x 2.3 x 2.8 cm. No definite solid mass, hydronephrosis or shadowing calcification.  Bladder:  Decompressed by Foley catheter, unable to evaluate.  IMPRESSION: BILATERAL renal cysts corresponding to low-attenuation masses identified on CT.  No evidence of solid renal mass or hydronephrosis.   Electronically Signed   By: Ulyses Southward M.D.   On: 06/25/2014 16:49         Subjective: Patient is less agitated but still has intermittent episodes of aggression. Pleasant and confused when aroused. No reports of vomiting, no distress, uncontrolled pain, diarrhea  Objective: Filed Vitals:   06/30/14 1500 06/30/14  2134 07/01/14 0540 07/01/14 1100  BP: 120/57 128/77 134/66 128/63  Pulse: 80 69 77 75  Temp: 97.4 F (36.3 C) 98.3 F (36.8 C) 97.5 F (36.4 C) 98.3 F (36.8 C)  TempSrc: Axillary Oral Oral Oral  Resp: 18 18 18 16   SpO2: 100% 99% 99% 100%    Intake/Output Summary (Last 24 hours) at 07/01/14  1655 Last data filed at 07/01/14 1514  Gross per 24 hour  Intake 1282.5 ml  Output   2100 ml  Net -817.5 ml   Weight change:  Exam:   General:  Pt is alert, does not follow commands appropriately, not in acute distress  HEENT: No icterus,[place]/AT  Cardiovascular: RRR, S1/S2, no rubs, no gallops  Respiratory: diminished breath sounds at the bases, but clear to auscultation  Abdomen: Soft/+BS, non tender, non distended, no guarding  Extremities: No edema, No lymphangitis, No petechiae, No rashes, no synovitis  Data Reviewed: Basic Metabolic Panel:  Recent Labs Lab 06/27/14 0525 06/28/14 0450 06/29/14 1535 06/30/14 0530 07/01/14 0505  NA 145 139 142 142 143  K 4.9 3.9 3.7 3.5* 4.1  CL 109 104 107 107 110  CO2 24 24 21 24 25   GLUCOSE 103* 127* 175* 104* 99  BUN 16 16 19 18 14   CREATININE 0.76 0.78 0.86 0.73 0.77  CALCIUM 9.1 8.6 8.3* 8.9 8.7   Liver Function Tests:  Recent Labs Lab 06/25/14 1149  AST 19  ALT 15  ALKPHOS 71  BILITOT 0.9  PROT 6.3  ALBUMIN 3.2*    Recent Labs Lab 06/25/14 1149  LIPASE 20    Recent Labs Lab 06/29/14 0420  AMMONIA 36   CBC:  Recent Labs Lab 06/25/14 1149 06/26/14 0435 06/27/14 0525 06/28/14 0450 06/29/14 1535 06/30/14 0530  WBC 7.5 7.5 6.6 6.5 12.1* 7.4  NEUTROABS 5.9  --   --   --   --   --   HGB 15.6 17.0 15.4 14.6 13.8 13.5  HCT 44.8 47.3 45.0 42.8 39.7 40.0  MCV 95.9 94.4 95.7 95.7 94.1 96.9  PLT 109* 121* 126* 120* 142* 140*   Cardiac Enzymes: No results for input(s): CKTOTAL, CKMB, CKMBINDEX, TROPONINI in the last 168 hours. BNP: Invalid input(s): POCBNP CBG: No results for input(s): GLUCAP in the last 168 hours.  Recent Results (from the past 240 hour(s))  Urine culture     Status: None   Collection Time: 06/28/14  7:45 PM  Result Value Ref Range Status   Specimen Description URINE, RANDOM  Final   Special Requests NONE  Final   Culture  Setup Time   Final    06/29/2014 00:21 Performed at  [facility]   Colony Count NO GROWTH Performed at [facility]  Final   Culture NO GROWTH Performed at [facility]  Final   Report Status 06/30/2014 FINAL  Final     Scheduled Meds: . divalproex  250 mg Oral Q12H  . docusate sodium  100 mg Oral BID  . feeding supplement (RESOURCE BREEZE)  1 Container Oral TID BM  . finasteride  5 mg Oral Q breakfast  . latanoprost  1 drop Both Eyes QHS  . memantine  5 mg Oral Daily  . QUEtiapine  50 mg Oral BID  . tamsulosin  0.4 mg Oral QPC supper   Continuous Infusions: . sodium chloride 75 mL/hr at 07/01/14 1410     Johnathan Reinig, DO  [facility] Pager [phone]  If 7PM-7AM, please contact night-coverage [email or web address]  Password [email or web address] 07/01/2014, 4:55 PM   LOS: 9 days

## 2014-07-01 NOTE — Discharge Summary (Addendum)
Physician Discharge Summary  Johnathan Browning AVW:[id number] DOB: 01-09-1931 DOA: 06/22/2014  PCP: Johnathan Browning  Admit date: 06/22/2014 Discharge date: 07/04/14 Recommendations for Outpatient Follow-up:  Only to use medications to control agitation or comfort.   When he leaves hospital family does not want transfer back to hospital  Discharge Diagnoses:  AKI  -likely a combination of BOO, dehdration vs. ?lab error  -Patient was also getting as needed ibuprofen for abdominal pain in the ED.  -The patient has pulled on his Foley  -improved  -Serum creatinine is back to baseline  -Serum creatinine 0.73  -continue IVF as he has poor po intake -Medically stable for transfer to geriatric psychiatric facility or SNF with hospice -with resolution of the patient's renal failure and optimization of his electrolytes , we'll not check any further labs at this time as the focus of care has been transitioning more towards comfort focus. --I spoke with the patient's wife and daughter on 07/02/2014--although the patient does not qualify for residential hospice, he may still qualify for SNF with hospice care services with which the family is agreeable Gross hematuria  -Patient has pulled on his Foley catheter  -Then developed with gross hematuria  -3 way Foley catheter was placed and flushed  -I have consulted urology--appreciate Johnathan Browning's input-->keep foley catheter in place for next 5 days and then d/c for a voiding trial  -06/29/14--continues to have gross hematuria with clots-->reconsulted Johnathan Browning-->foley reflushed-->hematuria now resolved  -continue finasteride and Flomax  - medically stable-monitor Hgb--stable  Dementia with delirium and aggressive behavior  -after discussion with the patient's wife, it appears that the patient has noted a gradual decline since the onset of dementia in his mid 6660s -Unfortunately, the patient has had a more rapid decline in the  past one month with increasing agitation and aggressive behavior -Haldol and Ativan prn for aggitation  -Patient has needed rare doses of ativan  -Has severe baseline dementia.  -Patient has been started on Depakote and seroquel for his behavioral symptoms-->much less aggressive when not disturbed -awaiting bed at geriatric psych facility  -Reconsulted psychiatry--discussed with Johnathan Browning--> Depakote increased to 250 mg twice a day and Seroquel increased to 50 mg bid with improvement in the patient's aggressive behavior  -10/29--check for reversible causes of delirium--  ----serum B12--367-->low normal-->start B12 supplementation -RBC folate--634, TSH--1.680, ammonia--36  -10/29--start low dose namenda and titrate  -the patient's overall agitation and aggressive behavior have significantly improved--he has not required any restraints for nearly 72 hours after adjustment of his medications. Goals of care -long discussion with wife today--she gives a history of gradual decline of his dementia since his mid 3560s, but has had an acute worsening in the last month with increasing aggression -Patient is presently DO NOT RESUSCITATE -Wife verbalizes a desire to focus of the quality of pt's life at this point-->consulted palliative medicine -appreciate Johnathan Browning-->no more labs or xrays-->no rehospitalization after d/c-->focus on comfort Thrombocytopenia  -chronic  Monitor closely. Will place on SCDs  -Platelets remained stable  -Suprapubic Abdominal pain  -Active secondary to bladder outlet obstruction with suprapubic tenderness  -Urinalysis without pyuria  -Renal ultrasound negative for hydronephrosis  -CT abdomen and pelvis negative for hydronephrosis or perinephric stranding  -10/29repeated UA and urine culture--neg  protein calorie malnutrition  -Nutrition consult   Discharge Condition: stable  Disposition: SNF  Diet: regular Wt Readings from Last 3  Encounters:  07/01/14 65 kg (143 lb 4.8 oz)  11/22/12 69.854 kg (154 lb)  04/06/12 70.217 kg (154 lb 12.8 oz)    History of present illness:  80 year old male resident of a memory unit with severe Alzheimer's dementia, history of elevated PSA, GERD, hyperlipidemia who has been in the [facility] ED in-stent/23/2015 for aggressive behavior. Patient is from a facility and had a fall on 10/20 . There is no history of loss of consciousness or any injury sustained. As per wife patient since then has been having intermittent episode of aggressive behavior . Patient was seen by psychiatry in the ED and started on Depakote and Seroquel. [facility] Whatever this morning patient complained of severe abdominal pain and ED physician was consulted. As as a part of workup CBC and chemistry was sent and a CT scan of the abdomen and pelvis was done in the ED. He was found to have mild thrombocytopenia with acute kidney injury with BUN of 36 and creatinine of 6.51 (was normal 3 days ago). UA was repeated unremarkable. A CT scan of the abdomen and pelvis ruled out any acute abdominal or pelvic pathology, renal or ureteral stone but showed enlarged prostate. The patient remained afebrile and hemodynamically stable throughout the hospitalization. The patient was started on intravenous fluids and a Foley catheter was inserted. The patient's renal failure improved and returned to baseline. Medications were adjusted by psychiatry for the patient's agitation. His agitation improved. The patient did have hematuria secondary to his aggressive behavior and pulling on his Foley catheter. This did gradually resolve. Urology was consulted and recommended keeping the Foley for another 5 days prior to discontinuing it for a voiding trial. They recommended continuing finasteride and Flomax.unfortunately, the patient continued to have hematuria and clots. Urology was reconsulted on  06/29/2014. The patient's Foley catheter was flushed again and became clear. The patient did not have any further hematuria. The patient did have a transient episode of hypotension with a systolic blood pressure in the upper 80s. The patient was started on IV fluids with improvement. Based upon urology's Recommendation, the patient was started on empiric ceftriaxone. He received 3 days. After discussion with the patient's wife, it was apparent that she wanted to focus on quality of the patient's life rather than quantity.  Given the patient's advanced dementia and expected cognitive decline, Palliative medicine was consulted.   After meeting with the patient's family, it was determined to change the focus of care more toward that of comfort. The patient's labs and x-rays were discontinued.  MOST form was filled out. The patient's fluids and antibiotics were discontinued. It is the intention of not readmitting the patient after the patient is discharged. With the assistance of social work, the family desired for the patient to go to a skilled nursing facility with hospice care.    Consultants: Urology--Dr. Marlou PorchHerrick Psychiatry--Dr. Jonnalagadda  Discharge Exam: Filed Vitals:   07/03/14 0527  BP: 149/75  Pulse: 76  Temp: 97.6 F (36.4 C)  Resp: 18   Filed Vitals:   07/02/14 0549 07/02/14 1400 07/02/14 2245 07/03/14 0527  BP: 139/78 141/71 155/78 149/75  Pulse: 78 91 96 76  Temp: 97.7 F (36.5 C) 98.6 F (37 C) 97.9 F (36.6 C) 97.6 F (36.4 C)  TempSrc: Oral  Axillary Axillary  Resp: 16 16 16 18   Height:      Weight:      SpO2: 99% 100% 97% 100%   General: Awake and alert, NAD Cardiovascular: RRR, no rub, no gallop, no S3 Respiratory: poor  inspiratory effort but clear to auscultation. Abdomen:soft, nontender, nondistended, positive bowel sounds Extremities: No edema, No lymphangitis, no petechiae  Discharge Instructions      Discharge Instructions    Diet - low sodium heart  healthy    Complete by:  As directed      Increase activity slowly    Complete by:  As directed             Medication List    STOP taking these medications        aspirin EC 81 MG tablet      TAKE these medications        acetaminophen 500 MG tablet  Commonly known as:  TYLENOL  Take 500 mg by mouth every 6 (six) hours as needed for mild pain.     bimatoprost 0.01 % Soln  Commonly known as:  LUMIGAN  Place 1 drop into both eyes at bedtime.     divalproex 250 MG DR tablet  Commonly known as:  DEPAKOTE  Take 1 tablet (250 mg total) by mouth every 12 (twelve) hours.     feeding supplement (RESOURCE BREEZE) Liqd  Take 1 Container by mouth 3 (three) times daily between meals.     feeding supplement (ENSURE COMPLETE) Liqd  Take 237 mLs by mouth 2 (two) times daily between meals.     finasteride 5 MG tablet  Commonly known as:  PROSCAR  Take 5 mg by mouth daily with breakfast.     memantine 5 MG tablet  Commonly known as:  NAMENDA  Take 1 tablet (5 mg total) by mouth daily.     QUEtiapine 50 MG tablet  Commonly known as:  SEROQUEL  Take 1 tablet (50 mg total) by mouth 2 (two) times daily.     tamsulosin 0.4 MG Caps capsule  Commonly known as:  FLOMAX  Take 1 capsule (0.4 mg total) by mouth daily after supper.         The results of significant diagnostics from this hospitalization (including imaging, microbiology, ancillary and laboratory) are listed below for reference.    Significant Diagnostic Studies: Dg Chest 2 View  06/19/2014   CLINICAL DATA:  Chest pain which started yesterday.  EXAM: CHEST  2 VIEW  COMPARISON:  05/24/2014  FINDINGS: The heart size and mediastinal contours are within normal limits. Low lung volumes with vascular crowding and streaky atelectasis. Both lungs are otherwise clear. The visualized skeletal structures are unremarkable.  IMPRESSION: Low lung volumes with mild vascular crowding and streaky atelectasis but no infiltrates, edema or  effusions.   Electronically Signed   By: Loralie ChampagneMark  Gallerani M.D.   On: 06/19/2014 11:02   Dg Ribs Unilateral W/chest Left  06/22/2014   CLINICAL DATA:  Status post fall at nursing facility yesterday. The patient complained of chest pain that started this morning.  EXAM: LEFT RIBS AND CHEST - 3+ VIEW  COMPARISON:  June 19, 2014  FINDINGS: No fracture or other bone lesions are seen involving the ribs. There is minimal chronic deformity of the lateral left seventh rib unchanged compared to prior exam. There is no evidence of pneumothorax or pleural effusion. There is no focal infiltrate, pulmonary edema, or pleural effusion. There is minimal chronic blunting of the left costophrenic angle. Heart size and mediastinal contours are within normal limits. Surgical clips are identified in the right upper quadrant unchanged.  IMPRESSION: No acute fracture or dislocation. No acute cardiopulmonary disease identified.   Electronically Signed   By: Scherry RanWei-Chen  Juel Burrow M.D.   On: 06/22/2014 17:29   Ct Abdomen Pelvis W Contrast  06/25/2014   CLINICAL DATA:  Patient has dementia and cannot give a history. Staff noted that when he palpated his abdomen that he seemed to be uncomfortable. On my exam he is diffusely tender without peritoneal signs. He is afebrile at this time.  EXAM: CT ABDOMEN AND PELVIS WITHOUT CONTRAST  TECHNIQUE: Multidetector CT imaging of the abdomen and pelvis was performed following the standard protocol without IV contrast.  COMPARISON:  None.  FINDINGS: Bilateral small pleural effusions.  No renal, ureteral, or bladder calculi. No obstructive uropathy. No perinephric stranding is seen. There are bilateral hypodense, fluid attenuating renal mass is most consistent with cysts. The largest right renal interpolar mass measures 4.7 x 3.6 cm. The largest left renal upper pole mass measures 2.5 x 2.6 cm. The bladder is unremarkable. The prostate gland is enlarged measuring 6.1 x 6.5 cm.  The liver demonstrates no  focal abnormality. The gallbladder is surgically absent. The spleen demonstrates no focal abnormality. The adrenal glands and pancreas are normal.  The unopacified stomach, duodenum, small intestine and large intestine are unremarkable, but evaluation is limited by lack of oral contrast. There is no pneumoperitoneum, pneumatosis, or portal venous gas. There is no abdominal or pelvic free fluid. There is no lymphadenopathy.  The abdominal aorta is normal in caliber with atherosclerosis.  There is degenerative disc disease most severe at L3-4. There is 2 mm of anterolisthesis of L4 on L5 secondary to facet arthropathy. There is severe facet arthropathy at L1-2, L2-3, L3-4, L4-5 and L5-S1.  IMPRESSION: 1. No acute abdominal or pelvic pathology. 2. No nephrolithiasis. 3. Bilateral renal cysts. 4. Lumbar spine spondylosis. 5. Prostatomegaly.  Correlate with PSA.   Electronically Signed   By: Elige Ko   On: 06/25/2014 13:14   US Renal  06/25/2014   CLINICAL DATA:  Obstructive uropathy, elevated BUN and creatinine  EXAM: RENAL/URINARY TRACT ULTRASOUND COMPLETE  COMPARISON:  CT abdomen and pelvis 06/25/2014  FINDINGS: Right Kidney:  Length: 11.2 cm. Normal cortical thickness and echogenicity. Two cysts identified at upper pole measuring 3.5 x 3.1 x 3.7 cm and 2.8 x 2.3 x 2.6 cm. Tiny hypoechoic nodule at inferior pole likely an additional cyst 1.6 x 1.1 x 1.3 cm. No definite solid mass, hydronephrosis or shadowing calcification.  Left Kidney:  Length: 11.5 cm. Normal cortical thickness and echogenicity. Small cyst at inferior pole 2.8 x 2.1 x 2.5 cm. Additional cyst at upper pole 2.3 x 2.3 x 2.8 cm. No definite solid mass, hydronephrosis or shadowing calcification.  Bladder:  Decompressed by Foley catheter, unable to evaluate.  IMPRESSION: BILATERAL renal cysts corresponding to low-attenuation masses identified on CT.  No evidence of solid renal mass or hydronephrosis.   Electronically Signed   By: Ulyses Southward M.D.    On: 06/25/2014 16:49     Microbiology: Recent Results (from the past 240 hour(s))  Urine culture     Status: None   Collection Time: 06/28/14  7:45 PM  Result Value Ref Range Status   Specimen Description URINE, RANDOM  Final   Special Requests NONE  Final   Culture  Setup Time   Final    06/29/2014 00:21 Performed at [facility]   Colony Count NO GROWTH Performed at [facility]  Final   Culture NO GROWTH Performed at [facility]  Final   Report Status 06/30/2014 FINAL  Final  Labs: Basic Metabolic Panel:  Recent Labs Lab 06/27/14 0525 06/28/14 0450 06/29/14 1535 06/30/14 0530 07/01/14 0505  NA 145 139 142 142 143  K 4.9 3.9 3.7 3.5* 4.1  CL 109 104 107 107 110  CO2 24 24 21 24 25   GLUCOSE 103* 127* 175* 104* 99  BUN 16 16 19 18 14   CREATININE 0.76 0.78 0.86 0.73 0.77  CALCIUM 9.1 8.6 8.3* 8.9 8.7   Liver Function Tests: No results for input(s): AST, ALT, ALKPHOS, BILITOT, PROT, ALBUMIN in the last 168 hours. No results for input(s): LIPASE, AMYLASE in the last 168 hours.  Recent Labs Lab 06/29/14 0420  AMMONIA 36   CBC:  Recent Labs Lab 06/27/14 0525 06/28/14 0450 06/29/14 1535 06/30/14 0530  WBC 6.6 6.5 12.1* 7.4  HGB 15.4 14.6 13.8 13.5  HCT 45.0 42.8 39.7 40.0  MCV 95.7 95.7 94.1 96.9  PLT 126* 120* 142* 140*   Cardiac Enzymes: No results for input(s): CKTOTAL, CKMB, CKMBINDEX, TROPONINI in the last 168 hours. BNP: Invalid input(s): POCBNP CBG: No results for input(s): GLUCAP in the last 168 hours.  Time coordinating discharge:  Greater than 30 minutes  Signed:  Tycen Dockter, DO [facility] Pager: [phone] 07/03/2014, 4:44 PM

## 2014-07-02 MED ORDER — MEMANTINE HCL 5 MG PO TABS: 5.0000 mg | ORAL_TABLET | Freq: Every day | ORAL | Status: AC

## 2014-07-02 MED ORDER — QUETIAPINE FUMARATE 50 MG PO TABS: 50.0000 mg | ORAL_TABLET | Freq: Two times a day (BID) | ORAL | Status: AC

## 2014-07-02 NOTE — Progress Notes (Signed)
Clinical Social Work  CSW reviewed chart which stated that PMT MD had met and completed GOC with family. CSW spoke with dtr Lenna Sciara) who reports they completed [place] and completed MOST form. Dtr reports that patient is not at hospice level at this time and although they do not want aggressive treatment they still want geri psych placement. Dtr reports that after patient's behaviors and medications are managed then they would consider contacting hospice again. Based on family's desires at DC and psych MD recommendations, CSW continuing to search for inpatient placement. CSW contacted the following facilities:  [facility]- no available beds    [facility]- admissions confirmed referral was received but reports it was not reviewed because they have no beds. Hospital will contact CSW if bed becomes available.  [facility] auth received from 10/30-11/5 # [id number]. Patient placed on waiting list on 10/30 and remains on list currently. CSW confirmed with Marlowe Kays in admissions that patient is on waiting list.  [facility]- denied on 10/29  [facility]- [facility] to inquire about any available beds  [facility]- denied on 10/30 due to dementia diagnosis  [facility]- no available beds   [facility]- cannot accept due to dementia diagnosis   [facility]- admissions reports no available beds but confirmed they received patient's referral on 10/30  [facility]- denied on 10/30  [facility]- denied on 10/29 due to acuity level. Now that patient is out of restraints and foley can be removed, CSW left a message with admissions to determine if they would reconsider accepting patient.  CSW will continue to follow.  [place], [place] [phone]

## 2014-07-02 NOTE — Progress Notes (Signed)
Clinical Social Work  CSW spoke with MD who reports patient's family is now wanting SNF placement with hospice services. CSW spoke with dtr who confirmed this information. Dtr requested that CSW contact [facility] to determine if they could accept patient back with hospice services but agreeable to SNF if ALF is unable to accept. Family is aware that Medicare will not cover SNF and hospice services so [facility]. CSW explained process and completed [facility] CSW called ALF and spoke with Aniceto BossAlma who will come and evaluate patient tomorrow.   CSW will continue to follow.  [place] Elward [place], [place] [phone]
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
## 2014-07-02 NOTE — Progress Notes (Addendum)
Clinical Social Work Department CLINICAL SOCIAL WORK PLACEMENT NOTE 07/02/2014  Patient:  Johnathan Browning,Johnathan Browning  Account Number:  [id number] Admit date:  06/22/2014  Clinical Social Worker:  Unk LightningHOLLY Abhay Godbolt, LCSW  Date/time:  07/02/2014 04:40 PM  Clinical Social Work is seeking post-discharge placement for this patient at the following level of care:   SKILLED NURSING   (*CSW will update this form in Epic as items are completed)   07/02/2014  Patient/family provided with [facility] Department of Clinical Social Work's list of facilities offering this level of care within the geographic area requested by the patient (or if unable, by the patient's family).  07/02/2014  Patient/family informed of their freedom to choose among providers that offer the needed level of care, that participate in Medicare, Medicaid or managed care program needed by the patient, have an available bed and are willing to accept the patient.  07/02/2014  Patient/family informed of [facility]' ownership interest in [facility], as well as of the fact that they are under no obligation to receive care at this facility.  PASARR submitted to EDS on 07/02/2014 PASARR number received on 07/02/2014  FL2 transmitted to all facilities in geographic area requested by pt/family on  07/02/2014 FL2 transmitted to all facilities within larger geographic area on   Patient informed that his/her managed care company has contracts with or will negotiate with  certain facilities, including the following:     Patient/family informed of bed offers received:   Patient chooses bed at  Physician recommends and patient chooses bed at    Patient to be transferred to  on   Patient to be transferred to facility by  Patient and family notified of transfer on  Name of family member notified:    The following physician request were entered in Epic:   Additional Comments:  07-05-14-patient will return to ALF with hospice  care

## 2014-07-02 NOTE — Progress Notes (Signed)
PROGRESS NOTE  Johnathan Browning ONG:[id number]:[id number] DOB: [birth year] DOA: 06/22/2014 PCP: Johnathan Browning  Assessment/Plan: AKI  -likely a combination of BOO, dehdration vs. ?lab error  -Patient was also getting as needed ibuprofen for abdominal pain in the ED.  -The patient has pulled on his Foley  -improved  -Serum creatinine is back to baseline  -Serum creatinine 0.73  -Medically stable for transfer to geriatric psychiatric facility or SNF with hospice -with resolution of the patient's renal failure and optimization of his electrolytes , we'll not check any further labs at this time as the focus of care has been transitioning more towards comfort focus. -I spoke with the patient's wife and daughter on 07/02/2014--although the patient does not qualify for residential hospice, he may still qualify for SNF with hospice care services with which the family is agreeable Gross hematuria  -Patient has pulled on his Foley catheter  -Then developed with gross hematuria  -3 way Foley catheter was placed and flushed  -I have consulted urology--appreciate Johnathan Browning's input-->keep foley catheter in place for next 5 days and then d/c for a voiding trial  -06/29/14--continues to have gross hematuria with clots-->reconsulted Johnathan Browning-->foley reflushed-->hematuria now resolved  -continue finasteride and Flomax  - medically stable-monitor Hgb--stable  Dementia with delirium and aggressive behavior  -after discussion with the patient's wife, it appears that the patient has noted a gradual decline since the onset of dementia in his mid 3560s -Unfortunately, the patient has had a more rapid decline in the past one month with increasing agitation and aggressive behavior -Haldol and Ativan prn for aggitation  -Patient has needed rare doses of ativan  -Has severe baseline dementia.  -Patient has been started on Depakote and seroquel for his behavioral symptoms-->much less  aggressive when not disturbed -awaiting bed at geriatric psych facility  -Reconsulted psychiatry--discussed with Johnathan Browning--> Depakote increased to 250 mg twice a day and Seroquel increased to 50 mg bid with improvement in the patient's aggressive behavior  -10/29--check for reversible causes of delirium--  ----serum B12--367-->low normal-->start B12 supplementation -RBC folate--634, TSH--1.680, ammonia--36  -10/29--start low dose namenda and titrate  -the patient's overall agitation and aggressive behavior have significantly improved--he has not required any restraints for nearly 48 hours after adjustment of his medications. Goals of care -long discussion with wife today--she gives a history of gradual decline of his dementia since his mid 5560s, but has had an acute worsening in the last month with increasing aggression -Patient is presently DO NOT RESUSCITATE -Wife verbalizes a desire to focus of the quality of pt's life at this point-->consulted palliative medicine -appreciate Johnathan Browning-->no more labs or xrays-->no rehospitalization after d/c-->focus on comfort Thrombocytopenia  -chronic  -Monitor closely. Will place on SCDs  -Platelets remained stable  -Suprapubic Abdominal pain  -Active secondary to bladder outlet obstruction with suprapubic tenderness  -Urinalysis without pyuria  -Renal ultrasound negative for hydronephrosis  -CT abdomen and pelvis negative for hydronephrosis or perinephric stranding  -10/29repeated UA and urine culture--neg  -pt received 3 days of ceftriaxone protein calorie malnutrition  -Nutrition consult      Family Communication:   Updated daughter and wife on phone--total time 35 min. Disposition Plan:   SNF with hospice       Procedures/Studies: Dg Chest 2 View  06/19/2014   CLINICAL DATA:  Chest pain which started yesterday.  EXAM: CHEST  2 VIEW  COMPARISON:  05/24/2014  FINDINGS: The heart size and mediastinal  contours are  within normal limits. Low lung volumes with vascular crowding and streaky atelectasis. Both lungs are otherwise clear. The visualized skeletal structures are unremarkable.  IMPRESSION: Low lung volumes with mild vascular crowding and streaky atelectasis but no infiltrates, edema or effusions.   Electronically Signed   By: Loralie Champagne M.D.   On: 06/19/2014 11:02   Dg Ribs Unilateral Browning/chest Left  06/22/2014   CLINICAL DATA:  Status post fall at nursing facility yesterday. The patient complained of chest pain that started this morning.  EXAM: LEFT RIBS AND CHEST - 3+ VIEW  COMPARISON:  June 19, 2014  FINDINGS: No fracture or other bone lesions are seen involving the ribs. There is minimal chronic deformity of the lateral left seventh rib unchanged compared to prior exam. There is no evidence of pneumothorax or pleural effusion. There is no focal infiltrate, pulmonary edema, or pleural effusion. There is minimal chronic blunting of the left costophrenic angle. Heart size and mediastinal contours are within normal limits. Surgical clips are identified in the right upper quadrant unchanged.  IMPRESSION: No acute fracture or dislocation. No acute cardiopulmonary disease identified.   Electronically Signed   By: Sherian Rein M.D.   On: 06/22/2014 17:29   Ct Abdomen Pelvis Browning Contrast  06/25/2014   CLINICAL DATA:  Patient has dementia and cannot give a history. Staff noted that when he palpated his abdomen that he seemed to be uncomfortable. On my exam he is diffusely tender without peritoneal signs. He is afebrile at this time.  EXAM: CT ABDOMEN AND PELVIS WITHOUT CONTRAST  TECHNIQUE: Multidetector CT imaging of the abdomen and pelvis was performed following the standard protocol without IV contrast.  COMPARISON:  None.  FINDINGS: Bilateral small pleural effusions.  No renal, ureteral, or bladder calculi. No obstructive uropathy. No perinephric stranding is seen. There are bilateral hypodense, fluid  attenuating renal mass is most consistent with cysts. The largest right renal interpolar mass measures 4.7 x 3.6 cm. The largest left renal upper pole mass measures 2.5 x 2.6 cm. The bladder is unremarkable. The prostate gland is enlarged measuring 6.1 x 6.5 cm.  The liver demonstrates no focal abnormality. The gallbladder is surgically absent. The spleen demonstrates no focal abnormality. The adrenal glands and pancreas are normal.  The unopacified stomach, duodenum, small intestine and large intestine are unremarkable, but evaluation is limited by lack of oral contrast. There is no pneumoperitoneum, pneumatosis, or portal venous gas. There is no abdominal or pelvic free fluid. There is no lymphadenopathy.  The abdominal aorta is normal in caliber with atherosclerosis.  There is degenerative disc disease most severe at L3-4. There is 2 mm of anterolisthesis of L4 on L5 secondary to facet arthropathy. There is severe facet arthropathy at L1-2, L2-3, L3-4, L4-5 and L5-S1.  IMPRESSION: 1. No acute abdominal or pelvic pathology. 2. No nephrolithiasis. 3. Bilateral renal cysts. 4. Lumbar spine spondylosis. 5. Prostatomegaly.  Correlate with PSA.   Electronically Signed   By: Elige Ko   On: 06/25/2014 13:14   US Renal  06/25/2014   CLINICAL DATA:  Obstructive uropathy, elevated BUN and creatinine  EXAM: RENAL/URINARY TRACT ULTRASOUND COMPLETE  COMPARISON:  CT abdomen and pelvis 06/25/2014  FINDINGS: Right Kidney:  Length: 11.2 cm. Normal cortical thickness and echogenicity. Two cysts identified at upper pole measuring 3.5 x 3.1 x 3.7 cm and 2.8 x 2.3 x 2.6 cm. Tiny hypoechoic nodule at inferior pole likely an additional cyst 1.6 x 1.1 x 1.3 cm.  No definite solid mass, hydronephrosis or shadowing calcification.  Left Kidney:  Length: 11.5 cm. Normal cortical thickness and echogenicity. Small cyst at inferior pole 2.8 x 2.1 x 2.5 cm. Additional cyst at upper pole 2.3 x 2.3 x 2.8 cm. No definite solid mass,  hydronephrosis or shadowing calcification.  Bladder:  Decompressed by Foley catheter, unable to evaluate.  IMPRESSION: BILATERAL renal cysts corresponding to low-attenuation masses identified on CT.  No evidence of solid renal mass or hydronephrosis.   Electronically Signed   By: Ulyses Southward M.D.   On: 06/25/2014 16:49         Subjective: Patient remains pleasantly confused. He is less agitated although has intermittent episodes of agitation. No reports of vomiting, or distress, diarrhea, uncontrolled pain  Objective: Filed Vitals:   07/01/14 1100 07/01/14 2155 07/02/14 0549 07/02/14 1400  BP: 128/63 139/64 139/78 141/71  Pulse: 75 91 78 91  Temp: 98.3 F (36.8 C) 98.1 F (36.7 C) 97.7 F (36.5 C) 98.6 F (37 C)  TempSrc: Oral Oral Oral   Resp: 16 16 16 16   Height: 5\' 7"  (1.702 m)     Weight: 65 kg (143 lb 4.8 oz)     SpO2: 100% 96% 99% 100%    Intake/Output Summary (Last 24 hours) at 07/02/14 1657 Last data filed at 07/02/14 1300  Gross per 24 hour  Intake  607.5 ml  Output   1426 ml  Net -818.5 ml   Weight change:  Exam:   General:  Pt is alert, does not follow commands appropriately, not in acute distress  HEENT: No icterus, No thrush, No neck mass, [place]/AT  Cardiovascular: RRR, S1/S2, no rubs, no gallops  Respiratory: for his poor effort but clear to auscultation  Abdomen: Soft/+BS, non tender, non distended, no guarding  Extremities: No edema, No lymphangitis, No petechiae, No rashes, no synovitis  Data Reviewed: Basic Metabolic Panel:  Recent Labs Lab 06/27/14 0525 06/28/14 0450 06/29/14 1535 06/30/14 0530 07/01/14 0505  NA 145 139 142 142 143  K 4.9 3.9 3.7 3.5* 4.1  CL 109 104 107 107 110  CO2 24 24 21 24 25   GLUCOSE 103* 127* 175* 104* 99  BUN 16 16 19 18 14   CREATININE 0.76 0.78 0.86 0.73 0.77  CALCIUM 9.1 8.6 8.3* 8.9 8.7   Liver Function Tests: No results for input(s): AST, ALT, ALKPHOS, BILITOT, PROT, ALBUMIN in the last 168 hours. No  results for input(s): LIPASE, AMYLASE in the last 168 hours.  Recent Labs Lab 06/29/14 0420  AMMONIA 36   CBC:  Recent Labs Lab 06/26/14 0435 06/27/14 0525 06/28/14 0450 06/29/14 1535 06/30/14 0530  WBC 7.5 6.6 6.5 12.1* 7.4  HGB 17.0 15.4 14.6 13.8 13.5  HCT 47.3 45.0 42.8 39.7 40.0  MCV 94.4 95.7 95.7 94.1 96.9  PLT 121* 126* 120* 142* 140*   Cardiac Enzymes: No results for input(s): CKTOTAL, CKMB, CKMBINDEX, TROPONINI in the last 168 hours. BNP: Invalid input(s): POCBNP CBG: No results for input(s): GLUCAP in the last 168 hours.  Recent Results (from the past 240 hour(s))  Urine culture     Status: None   Collection Time: 06/28/14  7:45 PM  Result Value Ref Range Status   Specimen Description URINE, RANDOM  Final   Special Requests NONE  Final   Culture  Setup Time   Final    06/29/2014 00:21 Performed at [facility]   Colony Count NO GROWTH Performed at [facility]  Final  Culture NO GROWTH Performed at [facility]  Final   Report Status 06/30/2014 FINAL  Final     Scheduled Meds: . divalproex  250 mg Oral Q12H  . docusate sodium  100 mg Oral BID  . feeding supplement (RESOURCE BREEZE)  1 Container Oral TID BM  . finasteride  5 mg Oral Q breakfast  . latanoprost  1 drop Both Eyes QHS  . memantine  5 mg Oral Daily  . QUEtiapine  50 mg Oral BID  . tamsulosin  0.4 mg Oral QPC supper   Continuous Infusions: . sodium chloride 75 mL/hr at 07/01/14 1410     Shelisha Gautier, DO  [facility] Pager [phone]  If 7PM-7AM, please contact night-coverage [email or web address] Password [email or web address] 07/02/2014, 4:57 PM   LOS: 10 days

## 2014-07-03 MED ORDER — CYANOCOBALAMIN 500 MCG PO TABS
500.0000 ug | ORAL_TABLET | Freq: Every day | ORAL | Status: DC
  Administered 2014-07-03 – 2014-07-05 (×3): 500 ug via ORAL
  Filled 2014-07-03 (×3): qty 1

## 2014-07-03 MED ORDER — ENSURE COMPLETE PO LIQD
237.0000 mL | Freq: Two times a day (BID) | ORAL | Status: DC
  Administered 2014-07-04 – 2014-07-05 (×2): 237 mL via ORAL

## 2014-07-03 MED ORDER — ENSURE COMPLETE PO LIQD: 237.0000 mL | Freq: Two times a day (BID) | ORAL | Status: AC

## 2014-07-03 NOTE — Progress Notes (Signed)
Clinical Social Work  CSW and Therapist, sports met at bedside with [employer] and [employer] in order to review patient's status. Representatives asked questions re: patient's care and behaviors. Representatives report they will return to their facilities and talk with nursing directors to see if they can accept patient. CSW will continue to follow.  [place], [place] [phone]

## 2014-07-03 NOTE — Discharge Summary (Signed)
Physician Discharge Summary  Johnathan Browning AVW:[id number] DOB: 01-09-1931 DOA: 06/22/2014  PCP: Sissy HoffSWAYNE,[name], MD  Admit date: 06/22/2014 Discharge date: 07/04/14 Recommendations for Outpatient Follow-up:  Only to use medications to control agitation or comfort.   When he leaves hospital family does not want transfer back to hospital  Discharge Diagnoses:  AKI  -likely a combination of BOO, dehdration vs. ?lab error  -Patient was also getting as needed ibuprofen for abdominal pain in the ED.  -The patient has pulled on his Foley  -improved  -Serum creatinine is back to baseline  -Serum creatinine 0.73  -continue IVF as he has poor po intake -Medically stable for transfer to geriatric psychiatric facility or SNF with hospice -with resolution of the patient's renal failure and optimization of his electrolytes , we'll not check any further labs at this time as the focus of care has been transitioning more towards comfort focus. --I spoke with the patient's wife and daughter on 07/02/2014--although the patient does not qualify for residential hospice, he may still qualify for SNF with hospice care services with which the family is agreeable Gross hematuria  -Patient has pulled on his Foley catheter  -Then developed with gross hematuria  -3 way Foley catheter was placed and flushed  -I have consulted urology--appreciate Dr. Jasmine AweHerrick's input-->keep foley catheter in place for next 5 days and then d/c for a voiding trial  -06/29/14--continues to have gross hematuria with clots-->reconsulted Dr. Anselm PancoastHerrick-->foley reflushed-->hematuria now resolved  -continue finasteride and Flomax  - medically stable-monitor Hgb--stable  Dementia with delirium and aggressive behavior  -after discussion with the patient's wife, it appears that the patient has noted a gradual decline since the onset of dementia in his mid 6660s -Unfortunately, the patient has had a more rapid decline in the  past one month with increasing agitation and aggressive behavior -Haldol and Ativan prn for aggitation  -Patient has needed rare doses of ativan  -Has severe baseline dementia.  -Patient has been started on Depakote and seroquel for his behavioral symptoms-->much less aggressive when not disturbed -awaiting bed at geriatric psych facility  -Reconsulted psychiatry--discussed with Dr. Elsie SaasJonnalagadda--> Depakote increased to 250 mg twice a day and Seroquel increased to 50 mg bid with improvement in the patient's aggressive behavior  -10/29--check for reversible causes of delirium--  ----serum B12--367-->low normal-->start B12 supplementation -RBC folate--634, TSH--1.680, ammonia--36  -10/29--start low dose namenda and titrate  -the patient's overall agitation and aggressive behavior have significantly improved--he has not required any restraints for nearly 72 hours after adjustment of his medications. Goals of care -long discussion with wife today--she gives a history of gradual decline of his dementia since his mid 3560s, but has had an acute worsening in the last month with increasing aggression -Patient is presently DO NOT RESUSCITATE -Wife verbalizes a desire to focus of the quality of pt's life at this point-->consulted palliative medicine -appreciate Dr. Towana BadgerLampkin-->no more labs or xrays-->no rehospitalization after d/c-->focus on comfort Thrombocytopenia  -chronic  Monitor closely. Will place on SCDs  -Platelets remained stable  -Suprapubic Abdominal pain  -Active secondary to bladder outlet obstruction with suprapubic tenderness  -Urinalysis without pyuria  -Renal ultrasound negative for hydronephrosis  -CT abdomen and pelvis negative for hydronephrosis or perinephric stranding  -10/29repeated UA and urine culture--neg  protein calorie malnutrition  -Nutrition consult   Discharge Condition: stable  Disposition: SNF  Diet: regular Wt Readings from Last 3  Encounters:  07/01/14 65 kg (143 lb 4.8 oz)  11/22/12 69.854 kg (154 lb)  04/06/12 70.217 kg (154 lb 12.8 oz)    History of present illness:  80 year old male resident of a memory unit with severe Alzheimer's dementia, history of elevated PSA, GERD, hyperlipidemia who has been in the [facility] ED in-stent/23/2015 for aggressive behavior. Patient is from a facility and had a fall on 10/20 . There is no history of loss of consciousness or any injury sustained. As per wife patient since then has been having intermittent episode of aggressive behavior . Patient was seen by psychiatry in the ED and started on Depakote and Seroquel. [facility] Whatever this morning patient complained of severe abdominal pain and ED physician was consulted. As as a part of workup CBC and chemistry was sent and a CT scan of the abdomen and pelvis was done in the ED. He was found to have mild thrombocytopenia with acute kidney injury with BUN of 36 and creatinine of 6.51 (was normal 3 days ago). UA was repeated unremarkable. A CT scan of the abdomen and pelvis ruled out any acute abdominal or pelvic pathology, renal or ureteral stone but showed enlarged prostate. The patient remained afebrile and hemodynamically stable throughout the hospitalization. The patient was started on intravenous fluids and a Foley catheter was inserted. The patient's renal failure improved and returned to baseline. Medications were adjusted by psychiatry for the patient's agitation. His agitation improved. The patient did have hematuria secondary to his aggressive behavior and pulling on his Foley catheter. This did gradually resolve. Urology was consulted and recommended keeping the Foley for another 5 days prior to discontinuing it for a voiding trial. They recommended continuing finasteride and Flomax.unfortunately, the patient continued to have hematuria and clots. Urology was reconsulted on  06/29/2014. The patient's Foley catheter was flushed again and became clear. The patient did not have any further hematuria. The patient did have a transient episode of hypotension with a systolic blood pressure in the upper 80s. The patient was started on IV fluids with improvement. Based upon urology's Recommendation, the patient was started on empiric ceftriaxone. He received 3 days. After discussion with the patient's wife, it was apparent that she wanted to focus on quality of the patient's life rather than quantity.  Given the patient's advanced dementia and expected cognitive decline, Palliative medicine was consulted.   After meeting with the patient's family, it was determined to change the focus of care more toward that of comfort. The patient's labs and x-rays were discontinued.  MOST form was filled out. The patient's fluids and antibiotics were discontinued. It is the intention of not readmitting the patient after the patient is discharged. With the assistance of social work, the family desired for the patient to go to a skilled nursing facility with hospice care.    Consultants: Urology--Dr. Marlou PorchHerrick Psychiatry--Dr. Jonnalagadda  Discharge Exam: Filed Vitals:   07/03/14 0527  BP: 149/75  Pulse: 76  Temp: 97.6 F (36.4 C)  Resp: 18   Filed Vitals:   07/02/14 0549 07/02/14 1400 07/02/14 2245 07/03/14 0527  BP: 139/78 141/71 155/78 149/75  Pulse: 78 91 96 76  Temp: 97.7 F (36.5 C) 98.6 F (37 C) 97.9 F (36.6 C) 97.6 F (36.4 C)  TempSrc: Oral  Axillary Axillary  Resp: 16 16 16 18   Height:      Weight:      SpO2: 99% 100% 97% 100%   General: Awake and alert, NAD Cardiovascular: RRR, no rub, no gallop, no S3 Respiratory: poor  inspiratory effort but clear to auscultation. Abdomen:soft, nontender, nondistended, positive bowel sounds Extremities: No edema, No lymphangitis, no petechiae  Discharge Instructions      Discharge Instructions    Diet - low sodium heart  healthy    Complete by:  As directed      Increase activity slowly    Complete by:  As directed             Medication List    STOP taking these medications        aspirin EC 81 MG tablet      TAKE these medications        acetaminophen 500 MG tablet  Commonly known as:  TYLENOL  Take 500 mg by mouth every 6 (six) hours as needed for mild pain.     bimatoprost 0.01 % Soln  Commonly known as:  LUMIGAN  Place 1 drop into both eyes at bedtime.     divalproex 250 MG DR tablet  Commonly known as:  DEPAKOTE  Take 1 tablet (250 mg total) by mouth every 12 (twelve) hours.     feeding supplement (RESOURCE BREEZE) Liqd  Take 1 Container by mouth 3 (three) times daily between meals.     feeding supplement (ENSURE COMPLETE) Liqd  Take 237 mLs by mouth 2 (two) times daily between meals.     finasteride 5 MG tablet  Commonly known as:  PROSCAR  Take 5 mg by mouth daily with breakfast.     memantine 5 MG tablet  Commonly known as:  NAMENDA  Take 1 tablet (5 mg total) by mouth daily.     QUEtiapine 50 MG tablet  Commonly known as:  SEROQUEL  Take 1 tablet (50 mg total) by mouth 2 (two) times daily.     tamsulosin 0.4 MG Caps capsule  Commonly known as:  FLOMAX  Take 1 capsule (0.4 mg total) by mouth daily after supper.         The results of significant diagnostics from this hospitalization (including imaging, microbiology, ancillary and laboratory) are listed below for reference.    Significant Diagnostic Studies: Dg Chest 2 View  06/19/2014   CLINICAL DATA:  Chest pain which started yesterday.  EXAM: CHEST  2 VIEW  COMPARISON:  05/24/2014  FINDINGS: The heart size and mediastinal contours are within normal limits. Low lung volumes with vascular crowding and streaky atelectasis. Both lungs are otherwise clear. The visualized skeletal structures are unremarkable.  IMPRESSION: Low lung volumes with mild vascular crowding and streaky atelectasis but no infiltrates, edema or  effusions.   Electronically Signed   By: Loralie ChampagneMark  Gallerani M.D.   On: 06/19/2014 11:02   Dg Ribs Unilateral W/chest Left  06/22/2014   CLINICAL DATA:  Status post fall at nursing facility yesterday. The patient complained of chest pain that started this morning.  EXAM: LEFT RIBS AND CHEST - 3+ VIEW  COMPARISON:  June 19, 2014  FINDINGS: No fracture or other bone lesions are seen involving the ribs. There is minimal chronic deformity of the lateral left seventh rib unchanged compared to prior exam. There is no evidence of pneumothorax or pleural effusion. There is no focal infiltrate, pulmonary edema, or pleural effusion. There is minimal chronic blunting of the left costophrenic angle. Heart size and mediastinal contours are within normal limits. Surgical clips are identified in the right upper quadrant unchanged.  IMPRESSION: No acute fracture or dislocation. No acute cardiopulmonary disease identified.   Electronically Signed   By: Scherry RanWei-Chen  Juel Burrow M.D.   On: 06/22/2014 17:29   Ct Abdomen Pelvis W Contrast  06/25/2014   CLINICAL DATA:  Patient has dementia and cannot give a history. Staff noted that when he palpated his abdomen that he seemed to be uncomfortable. On my exam he is diffusely tender without peritoneal signs. He is afebrile at this time.  EXAM: CT ABDOMEN AND PELVIS WITHOUT CONTRAST  TECHNIQUE: Multidetector CT imaging of the abdomen and pelvis was performed following the standard protocol without IV contrast.  COMPARISON:  None.  FINDINGS: Bilateral small pleural effusions.  No renal, ureteral, or bladder calculi. No obstructive uropathy. No perinephric stranding is seen. There are bilateral hypodense, fluid attenuating renal mass is most consistent with cysts. The largest right renal interpolar mass measures 4.7 x 3.6 cm. The largest left renal upper pole mass measures 2.5 x 2.6 cm. The bladder is unremarkable. The prostate gland is enlarged measuring 6.1 x 6.5 cm.  The liver demonstrates no  focal abnormality. The gallbladder is surgically absent. The spleen demonstrates no focal abnormality. The adrenal glands and pancreas are normal.  The unopacified stomach, duodenum, small intestine and large intestine are unremarkable, but evaluation is limited by lack of oral contrast. There is no pneumoperitoneum, pneumatosis, or portal venous gas. There is no abdominal or pelvic free fluid. There is no lymphadenopathy.  The abdominal aorta is normal in caliber with atherosclerosis.  There is degenerative disc disease most severe at L3-4. There is 2 mm of anterolisthesis of L4 on L5 secondary to facet arthropathy. There is severe facet arthropathy at L1-2, L2-3, L3-4, L4-5 and L5-S1.  IMPRESSION: 1. No acute abdominal or pelvic pathology. 2. No nephrolithiasis. 3. Bilateral renal cysts. 4. Lumbar spine spondylosis. 5. Prostatomegaly.  Correlate with PSA.   Electronically Signed   By: Elige Ko   On: 06/25/2014 13:14   US Renal  06/25/2014   CLINICAL DATA:  Obstructive uropathy, elevated BUN and creatinine  EXAM: RENAL/URINARY TRACT ULTRASOUND COMPLETE  COMPARISON:  CT abdomen and pelvis 06/25/2014  FINDINGS: Right Kidney:  Length: 11.2 cm. Normal cortical thickness and echogenicity. Two cysts identified at upper pole measuring 3.5 x 3.1 x 3.7 cm and 2.8 x 2.3 x 2.6 cm. Tiny hypoechoic nodule at inferior pole likely an additional cyst 1.6 x 1.1 x 1.3 cm. No definite solid mass, hydronephrosis or shadowing calcification.  Left Kidney:  Length: 11.5 cm. Normal cortical thickness and echogenicity. Small cyst at inferior pole 2.8 x 2.1 x 2.5 cm. Additional cyst at upper pole 2.3 x 2.3 x 2.8 cm. No definite solid mass, hydronephrosis or shadowing calcification.  Bladder:  Decompressed by Foley catheter, unable to evaluate.  IMPRESSION: BILATERAL renal cysts corresponding to low-attenuation masses identified on CT.  No evidence of solid renal mass or hydronephrosis.   Electronically Signed   By: Ulyses Southward M.D.    On: 06/25/2014 16:49     Microbiology: Recent Results (from the past 240 hour(s))  Urine culture     Status: None   Collection Time: 06/28/14  7:45 PM  Result Value Ref Range Status   Specimen Description URINE, RANDOM  Final   Special Requests NONE  Final   Culture  Setup Time   Final    06/29/2014 00:21 Performed at [facility]   Colony Count NO GROWTH Performed at [facility]  Final   Culture NO GROWTH Performed at [facility]  Final   Report Status 06/30/2014 FINAL  Final  Labs: Basic Metabolic Panel:  Recent Labs Lab 06/27/14 0525 06/28/14 0450 06/29/14 1535 06/30/14 0530 07/01/14 0505  NA 145 139 142 142 143  K 4.9 3.9 3.7 3.5* 4.1  CL 109 104 107 107 110  CO2 24 24 21 24 25   GLUCOSE 103* 127* 175* 104* 99  BUN 16 16 19 18 14   CREATININE 0.76 0.78 0.86 0.73 0.77  CALCIUM 9.1 8.6 8.3* 8.9 8.7   Liver Function Tests: No results for input(s): AST, ALT, ALKPHOS, BILITOT, PROT, ALBUMIN in the last 168 hours. No results for input(s): LIPASE, AMYLASE in the last 168 hours.  Recent Labs Lab 06/29/14 0420  AMMONIA 36   CBC:  Recent Labs Lab 06/27/14 0525 06/28/14 0450 06/29/14 1535 06/30/14 0530  WBC 6.6 6.5 12.1* 7.4  HGB 15.4 14.6 13.8 13.5  HCT 45.0 42.8 39.7 40.0  MCV 95.7 95.7 94.1 96.9  PLT 126* 120* 142* 140*   Cardiac Enzymes: No results for input(s): CKTOTAL, CKMB, CKMBINDEX, TROPONINI in the last 168 hours. BNP: Invalid input(s): POCBNP CBG: No results for input(s): GLUCAP in the last 168 hours.  Time coordinating discharge:  Greater than 30 minutes  Signed:  Radek Carnero, DO [facility] Pager: [phone] 07/03/2014, 4:46 PM

## 2014-07-03 NOTE — Progress Notes (Signed)
NUTRITION FOLLOW UP  INTERVENTION: Regular diet Discontinue Resource Breeze tid Add Ensure Complete po BID, each supplement provides 350 kcal and 13 grams of protein RD to follow.  NUTRITION DIAGNOSIS: Inadequate oral intake related to dementia as evidenced by documentation. -Continues  Goal: Intake of meals and supplements to meet >90% estimated needs.- not yet met.  Monitor:  Intake, labs, weight trend  Reason for Assessment: Consult for assessment of status and needs.  80 y.o. male  Admitting Dx: Agitation  ASSESSMENT: 10/27: Patient admitted with acute kidney injury.  Patient with a hx of Alzheimer's dementia.  Patient had been living at home until recently secondary to aggressive behavior he was placed at the [facility].  Intake poor since admit.  11/3:   Diet increased to regular Intake remains poor. Will change Resource to Ensure. Awaiting placement   Height: Ht Readings from Last 1 Encounters:  07/01/14 _0  (1.702 m)    Weight: Wt Readings from Last 1 Encounters:  07/01/14 143 lb 4.8 oz (65 kg)    Ideal Body Weight: 154 lbs  % Ideal Body Weight: unknown  Wt Readings from Last 10 Encounters:  07/01/14 143 lb 4.8 oz (65 kg)  11/22/12 154 lb (69.854 kg)  04/06/12 154 lb 12.8 oz (70.217 kg)  03/11/12 156 lb (70.761 kg)  02/15/12 156 lb 6 oz (70.931 kg)    Usual Body Weight: 154  % Usual Body Weight: unknown  BMI:  Unknown, no recent weight.  Estimated Nutritional Needs: Kcal: 1800-1900 Protein: 70-80 gm Fluid: >1.8L  Skin: intact  Diet Order: Diet regular Diet - low sodium heart healthy  EDUCATION NEEDS: -No education needs identified at this time   Intake/Output Summary (Last 24 hours) at 07/03/14 1527 Last data filed at 07/03/14 1125  Gross per 24 hour  Intake   2870 ml  Output   1651 ml  Net   1219 ml    Labs:   Recent Labs Lab 06/29/14 1535 06/30/14 0530 07/01/14 0505  NA 142 142 143  K 3.7 3.5* 4.1  CL 107  107 110  CO2 _1 BUN _2 CREATININE 0.86 0.73 0.77  CALCIUM 8.3* 8.9 8.7  GLUCOSE 175* 104* 99    CBG (last 3)  No results for input(s): GLUCAP in the last 72 hours.  Scheduled Meds: . divalproex  250 mg Oral Q12H  . docusate sodium  100 mg Oral BID  . feeding supplement (RESOURCE BREEZE)  1 Container Oral TID BM  . finasteride  5 mg Oral Q breakfast  . latanoprost  1 drop Both Eyes QHS  . memantine  5 mg Oral Daily  . QUEtiapine  50 mg Oral BID  . tamsulosin  0.4 mg Oral QPC supper    Continuous Infusions: . sodium chloride 75 mL/hr at 07/01/14 1410    Past Medical History  Diagnosis Date  . Alzheimer's dementia     for 10 years  . Right inguinal hernia   . Gross hematuria   . Elevated PSA 2001; 2003    bx negative  . History of nephrolithiasis   . History of esophageal reflux   . Enlarged prostate   . Hyperlipidemia   . GERD (gastroesophageal reflux disease)   . Blood in urine   . Abdominal pain   . Constipation   . Easy bruising   . Blood in stool   . Inguinal hernia   . Shortness of breath 03-11-12    with  exertion  . Acute kidney injury 06/25/2014    Past Surgical History  Procedure Laterality Date  . Cholecystectomy  07/2004  . Inguinal hernia repair  03/17/2012    Procedure: HERNIA REPAIR INGUINAL ADULT;  Surgeon: Odis Hollingshead, MD;  Location: [facility] ORS;  Service: General;  Laterality: Right;  . Hernia repair  03/17/12    [facility]    Antonieta Iba, RD, LDN Clinical Inpatient Dietitian Pager:  [phone] Weekend and after hours pager:  [phone]

## 2014-07-03 NOTE — Progress Notes (Signed)
Clinical Social Work  CSW continues to work on placement for patient. CSW spoke with [facility] representative, [place], who reports she had an emergency meeting and was unable to visit patient this morning but plans on coming to the hospital around 2pm. ALF agreeable to call CSW when they arrive at the hospital. CSW spoke with dtr via phone to update her on ALF visit. CSW explained no offers on patient for SNF placement and dtr agree[place] [facility]. [facility] and [facility] liaison to evaluate patient to determine if they can provide a bed offer. CSW will continue to follow to assist with DC planning.  [place]olly Chenae Brager, [place] [phone]

## 2014-07-04 NOTE — Progress Notes (Signed)
Clinical Social Work  CSW spoke with Estill Bamberg at [facility] who reports that they can accept patient back to their facility but family would need to hire 24 hour caregivers at facility for the first two weeks. ALF agreeable to hospice following patient at DC. CSW met with wife at bedside and spoke with dtr via phone. Dtr reports she will talk directly with ALF in order to confirm sitters. CSW informed CM and MD of DC plans. CSW will continue to follow.  [place], [place] [phone]

## 2014-07-04 NOTE — Progress Notes (Signed)
[facility] PROGRESS NOTE  Johnathan Browning MVH:[id number] DOB: 25-Dec-1930 DOA: 06/22/2014 PCP: Sissy HoffSWAYNE,DAVID W, MD  Assessment/Plan  AKI resolved with IVF.  Comfort measures only.  No further lab testing.    Gross hematuria, resolved.   -  Remove foley -  Voiding spontaneously after foley removal  -  Finasteride and Flomax   Dementia with delirium and aggressive behavior, gradual decline since the onset of dementia in his mid 6860s and more rapid decline in the past one month with increasing agitation and aggressive behavior -Haldol and Ativan prn for aggitation  -continue Depakote and seroquel  -10/29--started low dose namenda -the patients overall agitation and aggressive behavior have significantly improved  Goals of care, DO NOT RESUSCITATE, MOST form completed -appreciate Dr. Towana BadgerLampkin-->no more labs or xrays-->no rehospitalization after d/c-->focus on comfort  Thrombocytopenia, no further lab testing  -Suprapubic Abdominal pain, currently pain free -Active secondary to bladder outlet obstruction with suprapubic tenderness  -Urinalysis without pyuria  -Renal ultrasound negative for hydronephrosis  -CT abdomen and pelvis negative for hydronephrosis or perinephric stranding  -10/29 repeated UA and urine culture--neg   protein calorie malnutrition, offer supplements and liberalized diet  Diet:  regular Access:  PIV IVF:  off Proph:  SCDs  Code Status: DNR Family Communication: patient and wife Disposition Plan: to SNF or [facility].  SW working on disposition.  Medically stable for transfer whenever bed available.    HPI/Subjective:  Was awake for about 1.5h this morning, now sleeping again.  No need for sitter or restraints.  Foley removed this AM and two voids since.     Objective: Filed Vitals:   07/03/14 0527 07/03/14 1400 07/03/14 2048 07/04/14 0600  BP: 149/75 133/81 139/96 159/81  Pulse: 76 80 92 91  Temp: 97.6 F (36.4  C) 97.1 F (36.2 C) 98.5 F (36.9 C) 97.9 F (36.6 C)  TempSrc: Axillary Axillary Axillary Axillary  Resp: 18 20 18 18   Height:      Weight:      SpO2: 100% 100% 98% 99%    Intake/Output Summary (Last 24 hours) at 07/04/14 1333 Last data filed at 07/04/14 0748  Gross per 24 hour  Intake    200 ml  Output   1301 ml  Net  -1101 ml   Filed Weights   07/01/14 1100  Weight: 65 kg (143 lb 4.8 oz)    Exam:   General:  WM,  No acute distress, asleep  HEENT:  NCAT, MMM  Cardiovascular:  RRR, nl S1, S2 no mrg, 2+ pulses, warm extremities  Respiratory:  CTAB, no increased WOB  Abdomen:   NABS, soft, NT/ND  MSK:   Normal tone and bulk, no LEE  Neuro:  Grossly intact  Data Reviewed: Basic Metabolic Panel:  Recent Labs Lab 06/28/14 0450 06/29/14 1535 06/30/14 0530 07/01/14 0505  NA 139 142 142 143  K 3.9 3.7 3.5* 4.1  CL 104 107 107 110  CO2 24 21 24 25   GLUCOSE 127* 175* 104* 99  BUN 16 19 18 14   CREATININE 0.78 0.86 0.73 0.77  CALCIUM 8.6 8.3* 8.9 8.7   Liver Function Tests: No results for input(s): AST, ALT, ALKPHOS, BILITOT, PROT, ALBUMIN in the last 168 hours. No results for input(s): LIPASE, AMYLASE in the last 168 hours.  Recent Labs Lab 06/29/14 0420  AMMONIA 36   CBC:  Recent Labs Lab 06/28/14 0450 06/29/14 1535 06/30/14 0530  WBC 6.5 12.1* 7.4  HGB 14.6 13.8 13.5  HCT 42.8 39.7 40.0  MCV 95.7 94.1 96.9  PLT 120* 142* 140*   Cardiac Enzymes: No results for input(s): CKTOTAL, CKMB, CKMBINDEX, TROPONINI in the last 168 hours. BNP (last 3 results) No results for input(s): PROBNP in the last 8760 hours. CBG: No results for input(s): GLUCAP in the last 168 hours.  Recent Results (from the past 240 hour(s))  Urine culture     Status: None   Collection Time: 06/28/14  7:45 PM  Result Value Ref Range Status   Specimen Description URINE, RANDOM  Final   Special Requests NONE  Final   Culture  Setup Time   Final    06/29/2014  00:21 Performed at [facility]   Colony Count NO GROWTH Performed at [facility]  Final   Culture NO GROWTH Performed at [facility]  Final   Report Status 06/30/2014 FINAL  Final     Studies: No results found.  Scheduled Meds: . vitamin B-12  500 mcg Oral Daily  . divalproex  250 mg Oral Q12H  . docusate sodium  100 mg Oral BID  . feeding supplement (ENSURE COMPLETE)  237 mL Oral BID BM  . finasteride  5 mg Oral Q breakfast  . latanoprost  1 drop Both Eyes QHS  . memantine  5 mg Oral Daily  . QUEtiapine  50 mg Oral BID  . tamsulosin  0.4 mg Oral QPC supper   Continuous Infusions: . sodium chloride 75 mL/hr at 07/01/14 1410    Principal Problem:   Agitation Active Problems:   Dementia   Acute kidney injury   Renal failure   Dementia with behavioral disturbance   Thrombocytopenia   Abdominal pain   Bladder outlet obstruction   Hematuria   Palliative care encounter    Time spent: 30 min    Adamarys Shall, [facility]  [facility] Pager [phone]. If 7PM-7AM, please contact night-coverage at [email or web address], password [email or web address] 07/04/2014, 1:33 PM  LOS: 12 days

## 2014-07-04 NOTE — Progress Notes (Signed)
Clinical Social Work  CSW spoke with [facility] who reports Aniceto Bosslma is not in the building so unsure if decision has been made on whether or not they can accept patient. CS[facility] who reports they are reviewing with DON. CSW will continue to follow and has updated MD on DC barriers.  [place] Marybeth Dandy, [place] [phone]

## 2014-07-05 MED ORDER — LORAZEPAM 2 MG/ML IJ SOLN
1.0000 mg | Freq: Once | INTRAMUSCULAR | Status: AC
  Administered 2014-07-05: 1 mg via INTRAVENOUS

## 2014-07-05 MED ORDER — LORAZEPAM 2 MG/ML PO CONC: 1.0000 mg | ORAL | Status: AC | PRN

## 2014-07-05 NOTE — Discharge Summary (Addendum)
Physician Discharge Summary  Johnathan BillowHubert D Belli ZOX:[id number] DOB: 14-Oct-1930 DOA: 06/22/2014  PCP: Sissy HoffSWAYNE,DAVID W, MD  Admit date: 06/22/2014 Discharge date: 07/05/14 Recommendations for Outpatient Follow-up:  Only to use medications to control agitation or comfort.   When he leaves hospital family does not want transfer back to hospital [facility] with additional caregiver and hospice care  Condition:  Stable, improved  Diet:  Regular with thin liquids  Discharge Diagnoses:  AKI  -likely a combination of BOO, dehydration  -Patient was also getting as needed ibuprofen for abdominal pain in the ED.  -The patient has pulled on his Foley  -improved with IVF -Serum creatinine is back to baseline  -Medically stable for transfer to geriatric psychiatric facility or SNF with hospice -No further labs or IVF at this time as goal is to maximize comfort  Gross hematuria due to foley trauma. -Patient pulled on his Foley catheter then developed with gross hematuria  -3 way Foley catheter was placed and flushed  -Urology Dr. Jasmine AweHerrick's input was consulted, recommended keeping foley catheter in place for next 5 days and then d/c for a voiding trial  -06/29/14--continued to have gross hematuria with clots, reconsulted Dr. Marlou PorchHerrick, who flushed foley and hematuria resolved -continue finasteride and Flomax   Severe dementia with delirium and aggressive behavior, gradual decline since the onset of dementia in his mid 4060s and more rapid decline in the past one month with increasing agitation and aggressive behavior - Psychiatry was consulted -Haldol and Ativan prn for agitation  -Patient has needed rare doses of ativan   -Started on Depakote and seroquel for his behavioral symptoms which helped - Serum B12--367-->low normal-->started B12 supplementation but discontinued as likely not contributing much to his behavioral changes and minimizing medications - RBC folate--634,  TSH--1.680, ammonia--36  - 10/29--started low dose namenda  Goals of care:  Comfort measures only -  Palliative care consulted, Dr. Greig RightLampkin, no more labs or xrays-->no rehospitalizations and focus on comfort  Thrombocytopenia, plts remained stable.  SCDs during hospitalization.  -Suprapubic Abdominal pain Active secondary to bladder outlet obstruction with suprapubic tenderness  -Urinalysis without pyuria  -Renal ultrasound negative for hydronephrosis  -CT abdomen and pelvis negative for hydronephrosis or perinephric stranding  -10/29 repeated UA and urine culture--neg   Moderate protein calorie malnutrition,  - liberalized diet  Diet: regular Wt Readings from Last 3 Encounters:  07/01/14 65 kg (143 lb 4.8 oz)  11/22/12 69.854 kg (154 lb)  04/06/12 70.217 kg (154 lb 12.8 oz)    History of present illness:  80 year old male resident of a memory unit with severe Alzheimer's dementia, history of elevated PSA, GERD, hyperlipidemia who [facility]ent[facility]23/[facility] for aggressive behavior. Patient is from a facility and had a fall on 10/20 . There is no history of loss of consciousness or any injury sustained. As per wife patient since then has been having intermittent episode of aggressive behavior . Patient was seen by psychiatry in the ED and started on Depakote and Seroquel. [facility] Whatever this morning patient complained of severe abdominal pain and ED physician was consulted. As as a part of workup CBC and chemistry was sent and a CT scan of the abdomen and pelvis was done in the ED. He was found to have mild thrombocytopenia with acute kidney injury with BUN of 36 and creatinine of 6.51 (was normal 3 days ago). UA was repeated unremarkable. A CT scan of  the abdomen and pelvis ruled out any acute abdominal or pelvic pathology, renal or ureteral stone but showed enlarged prostate. The patient remained afebrile  and hemodynamically stable throughout the hospitalization. The patient was started on intravenous fluids and a Foley catheter was inserted. The patient's renal failure improved and returned to baseline. Medications were adjusted by psychiatry for the patient's agitation. His agitation improved. The patient did have hematuria secondary to his aggressive behavior and pulling on his Foley catheter. This did gradually resolve. Urology was consulted and recommended keeping the Foley for another 5 days prior to discontinuing it for a voiding trial. They recommended continuing finasteride and Flomax.unfortunately, the patient continued to have hematuria and clots. Urology was reconsulted on 06/29/2014. The patient's Foley catheter was flushed again and became clear. The patient did not have any further hematuria. The patient did have a transient episode of hypotension with a systolic blood pressure in the upper 80s. The patient was started on IV fluids with improvement. Based upon urology's Recommendation, the patient was started on empiric ceftriaxone. He received 3 days. After discussion with the patient's wife, it was apparent that she wanted to focus on quality of the patient's life rather than quantity.  Given the patient's advanced dementia and expected cognitive decline, Palliative medicine was consulted.   After meeting with the patient's family, it was determined to change the focus of care more toward that of comfort. The patient's labs and x-rays were discontinued.  MOST form was filled out. The patient's fluids and antibiotics were discontinued. It is the intention of not readmitting the patient after the patient is discharged. With the assistance of social work, the family desired for the patient to go to a skilled nursing facility with hospice care.  Consultants: Urology--Dr. Marlou Porch Psychiatry--Dr. Jonnalagadda  Discharge Exam: Filed Vitals:   07/05/14 0804  BP: 158/79  Pulse: 98  Temp: 97.6 F  (36.4 C)  Resp: 20   Filed Vitals:   07/04/14 1300 07/04/14 2133 07/05/14 0627 07/05/14 0804  BP: 140/57 153/81 157/81 158/79  Pulse: 88 97 97 98  Temp: 97.3 F (36.3 C) 99 F (37.2 C) 97.6 F (36.4 C) 97.6 F (36.4 C)  TempSrc: Axillary Oral Oral Oral  Resp: 18 20 18 20   Height:      Weight:      SpO2: 100% 97% 95% 94%   General:  Awake and alert, NAD, agitated but directable Cardiovascular: RRR, no rub, no gallop, no S3 Respiratory: Clear to auscultation, patient not able to comply with deep breaths Abdomen:soft, nontender, nondistended, positive bowel sounds Extremities: No edema, normal tone and bulk  Discharge Instructions      Discharge Instructions    Call MD for:  difficulty breathing, headache or visual disturbances    Complete by:  As directed      Call MD for:  persistant nausea and vomiting    Complete by:  As directed      Call MD for:  severe uncontrolled pain    Complete by:  As directed      Call MD for:  temperature >100.4    Complete by:  As directed      Diet general    Complete by:  As directed      Increase activity slowly    Complete by:  As directed      Increase activity slowly    Complete by:  As directed             Medication List  STOP taking these medications        aspirin EC 81 MG tablet      TAKE these medications        acetaminophen 500 MG tablet  Commonly known as:  TYLENOL  Take 500 mg by mouth every 6 (six) hours as needed for mild pain.     bimatoprost 0.01 % Soln  Commonly known as:  LUMIGAN  Place 1 drop into both eyes at bedtime.     divalproex 250 MG DR tablet  Commonly known as:  DEPAKOTE  Take 1 tablet (250 mg total) by mouth every 12 (twelve) hours.     feeding supplement (RESOURCE BREEZE) Liqd  Take 1 Container by mouth 3 (three) times daily between meals.     feeding supplement (ENSURE COMPLETE) Liqd  Take 237 mLs by mouth 2 (two) times daily between meals.     finasteride 5 MG tablet    Commonly known as:  PROSCAR  Take 5 mg by mouth daily with breakfast.     LORazepam 2 MG/ML concentrated solution  Commonly known as:  ATIVAN  Take 0.5 mLs (1 mg total) by mouth every 4 (four) hours as needed for anxiety or sleep.     memantine 5 MG tablet  Commonly known as:  NAMENDA  Take 1 tablet (5 mg total) by mouth daily.     QUEtiapine 50 MG tablet  Commonly known as:  SEROQUEL  Take 1 tablet (50 mg total) by mouth 2 (two) times daily.     tamsulosin 0.4 MG Caps capsule  Commonly known as:  FLOMAX  Take 1 capsule (0.4 mg total) by mouth daily after supper.         The results of significant diagnostics from this hospitalization (including imaging, microbiology, ancillary and laboratory) are listed below for reference.    Significant Diagnostic Studies: Dg Chest 2 View  06/19/2014   CLINICAL DATA:  Chest pain which started yesterday.  EXAM: CHEST  2 VIEW  COMPARISON:  05/24/2014  FINDINGS: The heart size and mediastinal contours are within normal limits. Low lung volumes with vascular crowding and streaky atelectasis. Both lungs are otherwise clear. The visualized skeletal structures are unremarkable.  IMPRESSION: Low lung volumes with mild vascular crowding and streaky atelectasis but no infiltrates, edema or effusions.   Electronically Signed   By: Loralie Champagne M.D.   On: 06/19/2014 11:02   Dg Ribs Unilateral W/chest Left  06/22/2014   CLINICAL DATA:  Status post fall at nursing facility yesterday. The patient complained of chest pain that started this morning.  EXAM: LEFT RIBS AND CHEST - 3+ VIEW  COMPARISON:  June 19, 2014  FINDINGS: No fracture or other bone lesions are seen involving the ribs. There is minimal chronic deformity of the lateral left seventh rib unchanged compared to prior exam. There is no evidence of pneumothorax or pleural effusion. There is no focal infiltrate, pulmonary edema, or pleural effusion. There is minimal chronic blunting of the left  costophrenic angle. Heart size and mediastinal contours are within normal limits. Surgical clips are identified in the right upper quadrant unchanged.  IMPRESSION: No acute fracture or dislocation. No acute cardiopulmonary disease identified.   Electronically Signed   By: Sherian Rein M.D.   On: 06/22/2014 17:29   Ct Abdomen Pelvis W Contrast  06/25/2014   CLINICAL DATA:  Patient has dementia and cannot give a history. Staff noted that when he palpated his abdomen that he seemed to be uncomfortable. On  my exam he is diffusely tender without peritoneal signs. He is afebrile at this time.  EXAM: CT ABDOMEN AND PELVIS WITHOUT CONTRAST  TECHNIQUE: Multidetector CT imaging of the abdomen and pelvis was performed following the standard protocol without IV contrast.  COMPARISON:  None.  FINDINGS: Bilateral small pleural effusions.  No renal, ureteral, or bladder calculi. No obstructive uropathy. No perinephric stranding is seen. There are bilateral hypodense, fluid attenuating renal mass is most consistent with cysts. The largest right renal interpolar mass measures 4.7 x 3.6 cm. The largest left renal upper pole mass measures 2.5 x 2.6 cm. The bladder is unremarkable. The prostate gland is enlarged measuring 6.1 x 6.5 cm.  The liver demonstrates no focal abnormality. The gallbladder is surgically absent. The spleen demonstrates no focal abnormality. The adrenal glands and pancreas are normal.  The unopacified stomach, duodenum, small intestine and large intestine are unremarkable, but evaluation is limited by lack of oral contrast. There is no pneumoperitoneum, pneumatosis, or portal venous gas. There is no abdominal or pelvic free fluid. There is no lymphadenopathy.  The abdominal aorta is normal in caliber with atherosclerosis.  There is degenerative disc disease most severe at L3-4. There is 2 mm of anterolisthesis of L4 on L5 secondary to facet arthropathy. There is severe facet arthropathy at L1-2, L2-3, L3-4,  L4-5 and L5-S1.  IMPRESSION: 1. No acute abdominal or pelvic pathology. 2. No nephrolithiasis. 3. Bilateral renal cysts. 4. Lumbar spine spondylosis. 5. Prostatomegaly.  Correlate with PSA.   Electronically Signed   By: Elige Ko   On: 06/25/2014 13:14   US Renal  06/25/2014   CLINICAL DATA:  Obstructive uropathy, elevated BUN and creatinine  EXAM: RENAL/URINARY TRACT ULTRASOUND COMPLETE  COMPARISON:  CT abdomen and pelvis 06/25/2014  FINDINGS: Right Kidney:  Length: 11.2 cm. Normal cortical thickness and echogenicity. Two cysts identified at upper pole measuring 3.5 x 3.1 x 3.7 cm and 2.8 x 2.3 x 2.6 cm. Tiny hypoechoic nodule at inferior pole likely an additional cyst 1.6 x 1.1 x 1.3 cm. No definite solid mass, hydronephrosis or shadowing calcification.  Left Kidney:  Length: 11.5 cm. Normal cortical thickness and echogenicity. Small cyst at inferior pole 2.8 x 2.1 x 2.5 cm. Additional cyst at upper pole 2.3 x 2.3 x 2.8 cm. No definite solid mass, hydronephrosis or shadowing calcification.  Bladder:  Decompressed by Foley catheter, unable to evaluate.  IMPRESSION: BILATERAL renal cysts corresponding to low-attenuation masses identified on CT.  No evidence of solid renal mass or hydronephrosis.   Electronically Signed   By: Ulyses Southward M.D.   On: 06/25/2014 16:49     Microbiology: Recent Results (from the past 240 hour(s))  Urine culture     Status: None   Collection Time: 06/28/14  7:45 PM  Result Value Ref Range Status   Specimen Description URINE, RANDOM  Final   Special Requests NONE  Final   Culture  Setup Time   Final    06/29/2014 00:21 Performed at [facility]   Colony Count NO GROWTH Performed at [facility]  Final   Culture NO GROWTH Performed at [facility]  Final   Report Status 06/30/2014 FINAL  Final     Labs: Basic Metabolic Panel:  Recent Labs Lab 06/29/14 1535 06/30/14 0530 07/01/14 0505  NA 142 142 143  K 3.7 3.5* 4.1  CL 107 107 110   CO2 21 24 25   GLUCOSE 175* 104* 99  BUN 19 18 14  CREATININE 0.86 0.73 0.77  CALCIUM 8.3* 8.9 8.7   Liver Function Tests: No results for input(s): AST, ALT, ALKPHOS, BILITOT, PROT, ALBUMIN in the last 168 hours. No results for input(s): LIPASE, AMYLASE in the last 168 hours.  Recent Labs Lab 06/29/14 0420  AMMONIA 36   CBC:  Recent Labs Lab 06/29/14 1535 06/30/14 0530  WBC 12.1* 7.4  HGB 13.8 13.5  HCT 39.7 40.0  MCV 94.1 96.9  PLT 142* 140*   Cardiac Enzymes: No results for input(s): CKTOTAL, CKMB, CKMBINDEX, TROPONINI in the last 168 hours. BNP: Invalid input(s): POCBNP CBG: No results for input(s): GLUCAP in the last 168 hours.  Time coordinating discharge:  20 minutes  Signed:  Renae FickleSHORT, Jennica Tagliaferri, DO [facility] Pager: [phone] 07/05/2014, 10:31 AM

## 2014-07-05 NOTE — Progress Notes (Signed)
Clinical Social Work  CSW spoke with John[facility] BossAlma [facility] who reports after reviewing patient and speaking with nursing care they can accept patient. CSW spoke with dtr who confirmed that 24/7 sitters have been arranged. CSW spoke with hospice representative who is aware of DC plans today. CSW spoke with dtr who reports she is agreeable to DC and mom will go to ALF to meet patient. RN aware of DC plans. PTAR arranged per family request. PTAR #: [id number].  CSW is signing off but available if needed.  Johnathan Browning, [place] [phone]

## 2014-07-05 NOTE — Progress Notes (Signed)
Patient disoriented and confused, alert only to self, patient with decrease urine output, bladder scan ordered by doctor, scan completed, urine amount in patient's bladder 200 cc, notified doctor, patient tolerated scan well without any difficulties, patient in stable condition at this time

## 2014-07-05 NOTE — Progress Notes (Signed)
Patient discharge to SNF, alert and orientation is questionable, [facility], discharge given for delivery to SNF, patient in stable condition at this time

## 2014-10-01 DEATH — deceased

## 2015-11-12 IMAGING — CR DG CHEST 2V
2 series · 2 of 2 positions shown · non-contrast
Comparison: 05/24/2014

CLINICAL DATA: Chest pain which started yesterday.

EXAM:
CHEST  2 VIEW

[x chest ap]
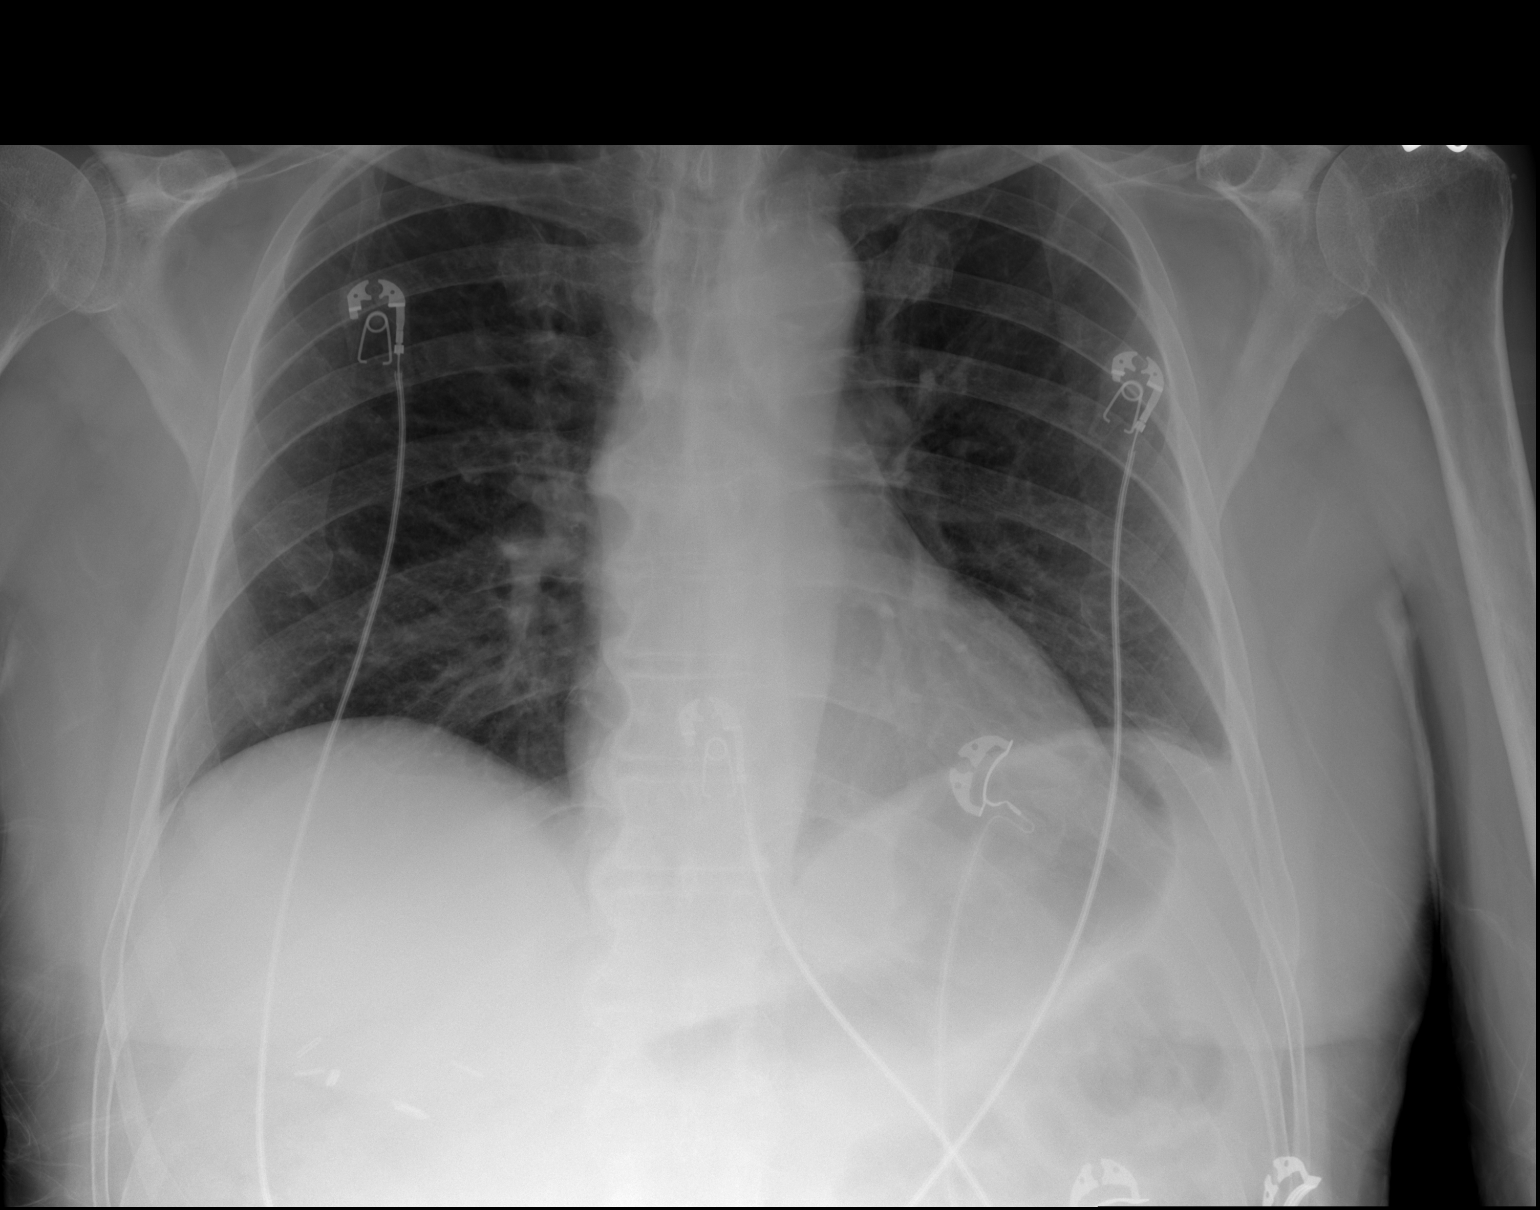

[w chest lat]
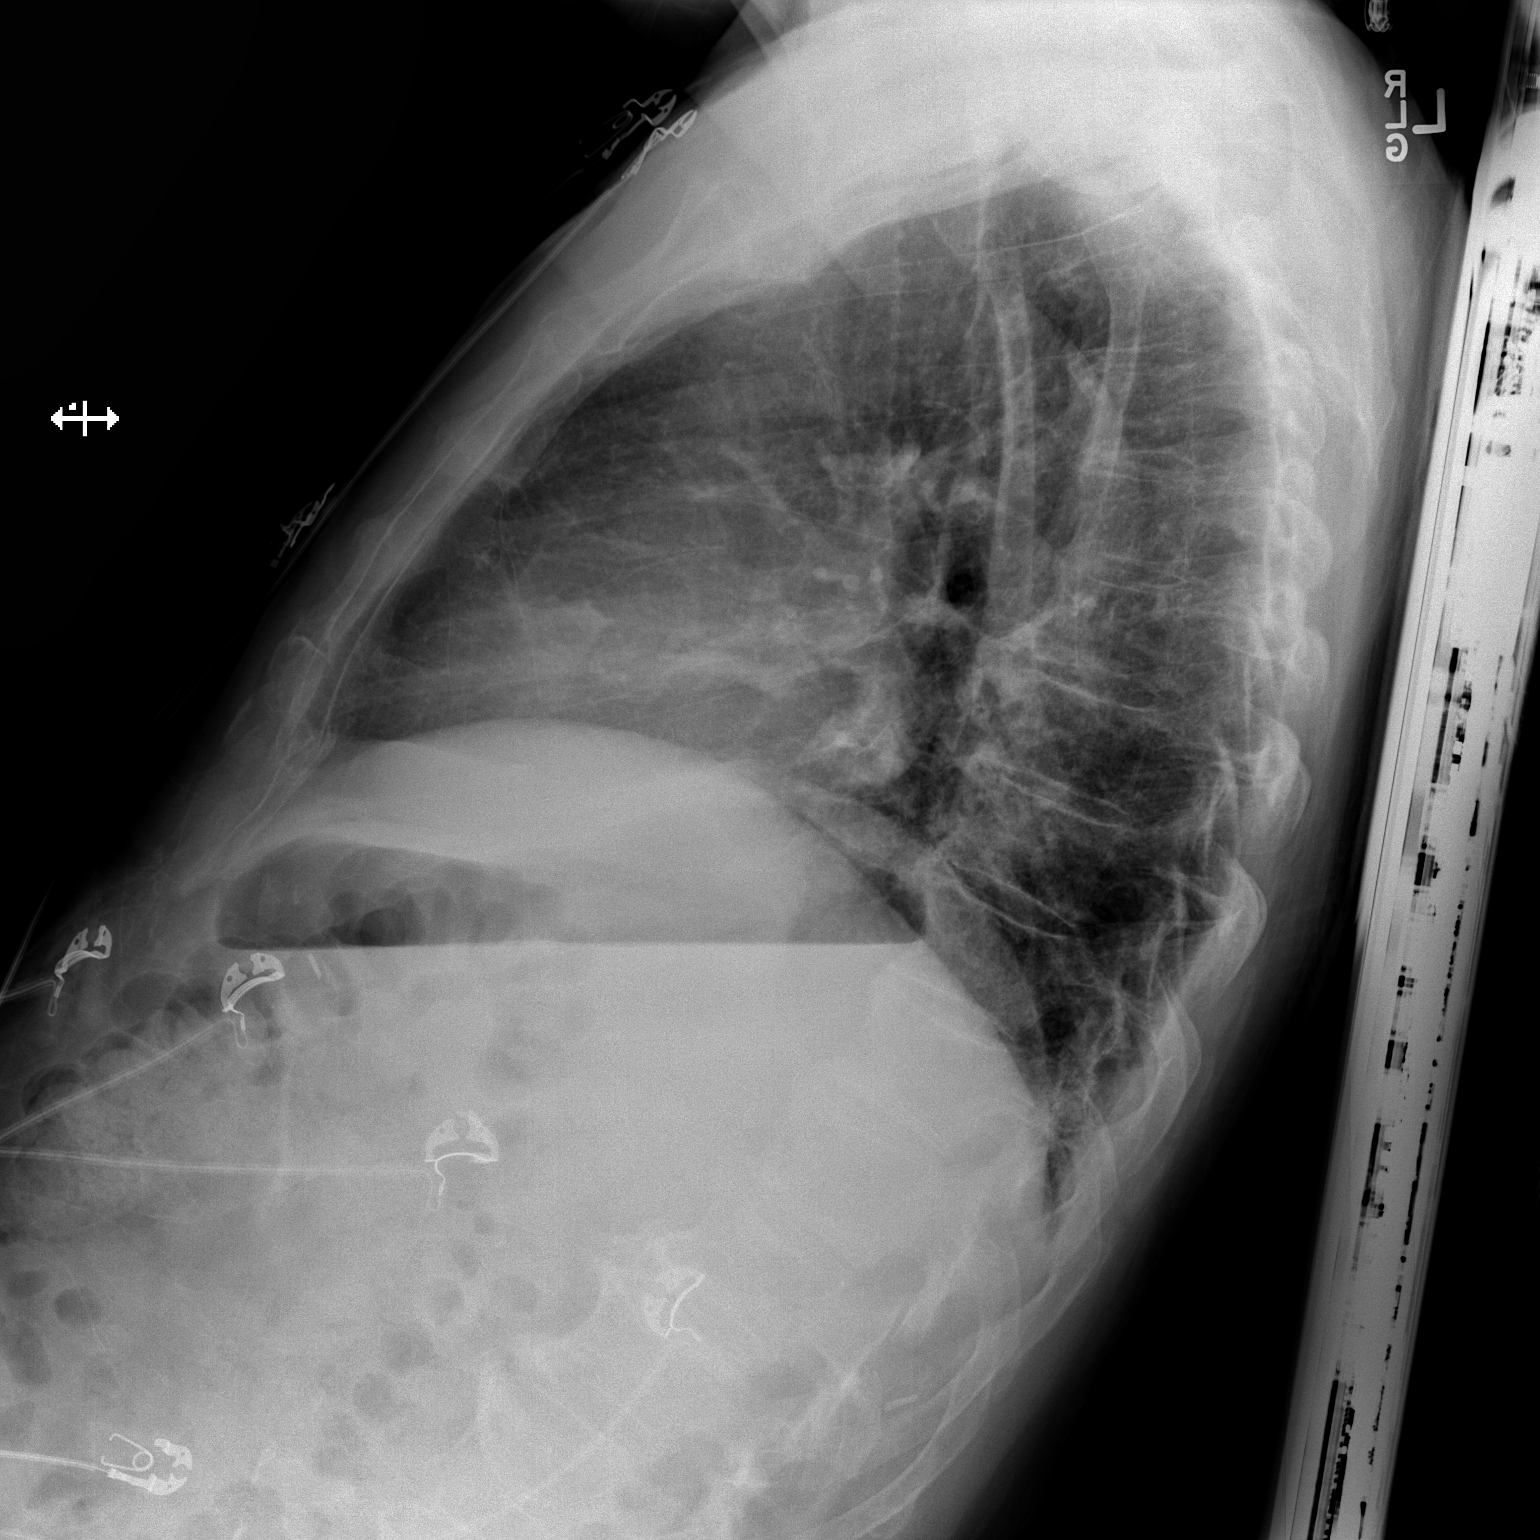

[2 of 2 positions shown; findings below may reference images not displayed]

FINDINGS: The heart size and mediastinal contours are within normal limits.
Low lung volumes with vascular crowding and streaky atelectasis.
Both lungs are otherwise clear. The visualized skeletal structures
are unremarkable.
IMPRESSION: Low lung volumes with mild vascular crowding and streaky atelectasis
but no infiltrates, edema or effusions.

## 2015-11-18 IMAGING — US US RENAL
1 series · 14 of 25 positions shown · non-contrast
Comparison: CT abdomen and pelvis 06/25/2014

CLINICAL DATA: Obstructive uropathy, elevated BUN and creatinine

EXAM:
RENAL/URINARY TRACT ULTRASOUND COMPLETE

[Series 1: us renal · 0.25mm/px · 14 of 42 slices shown]
[im 1/42]
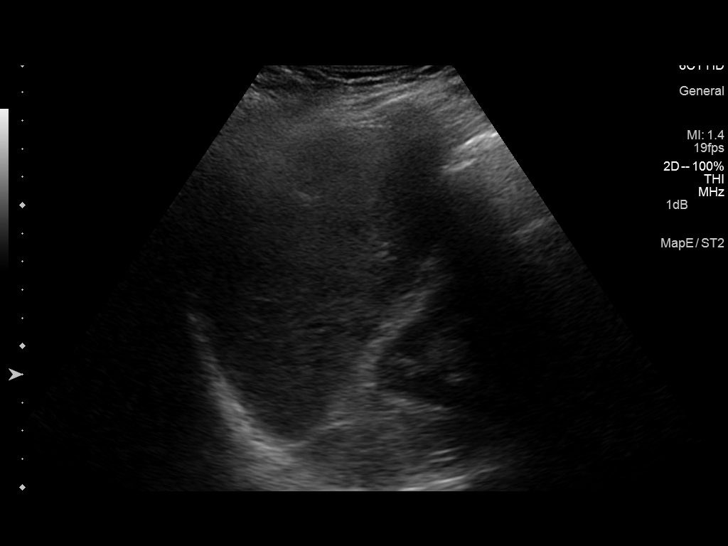
[im 4/42]
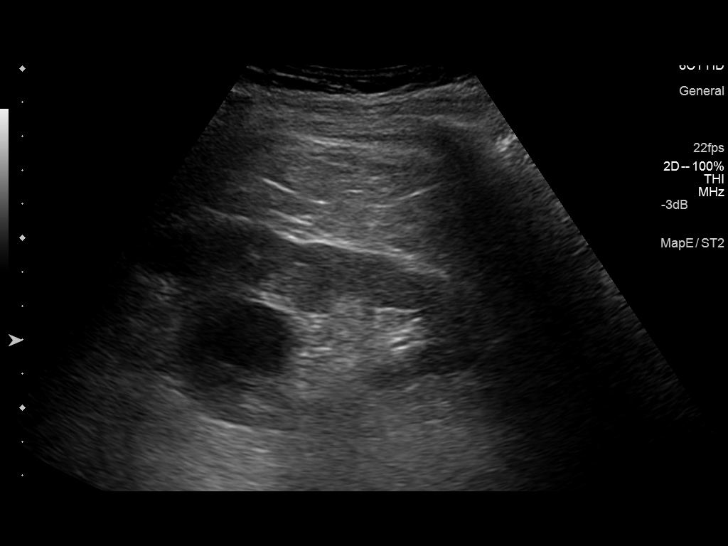
[im 7/42]
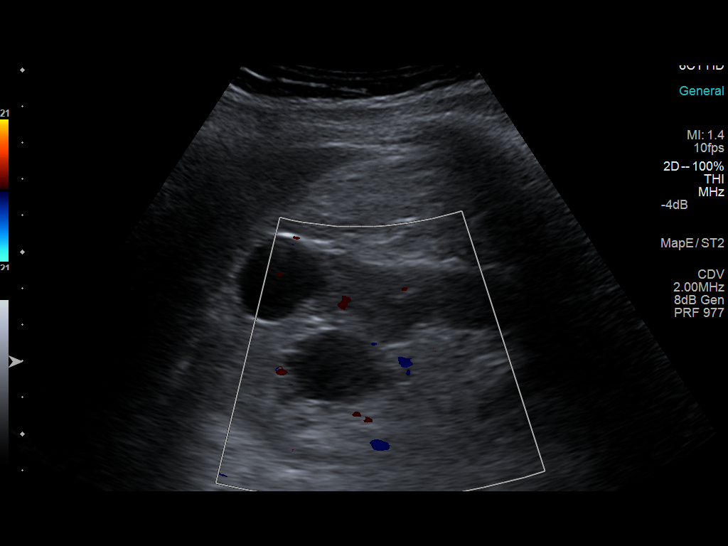
[im 11/42]
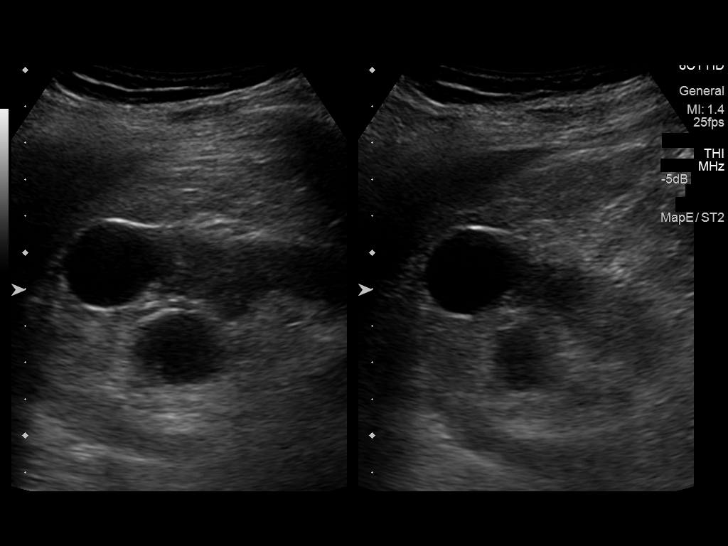
[im 14/42]
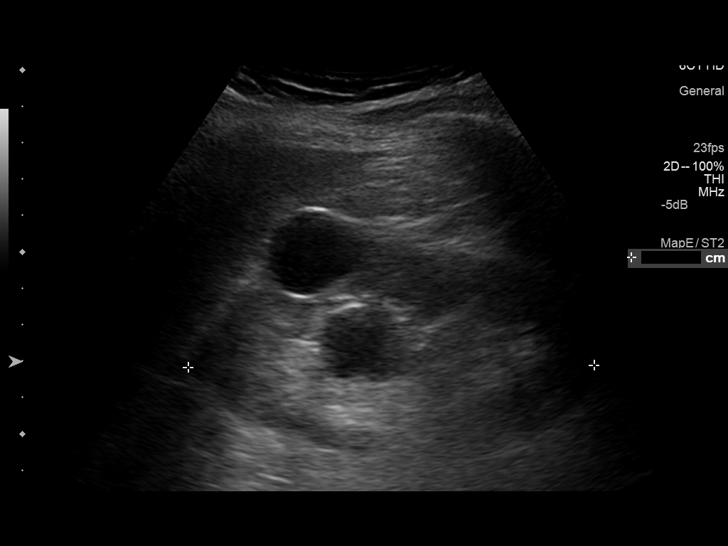
[im 16/42]
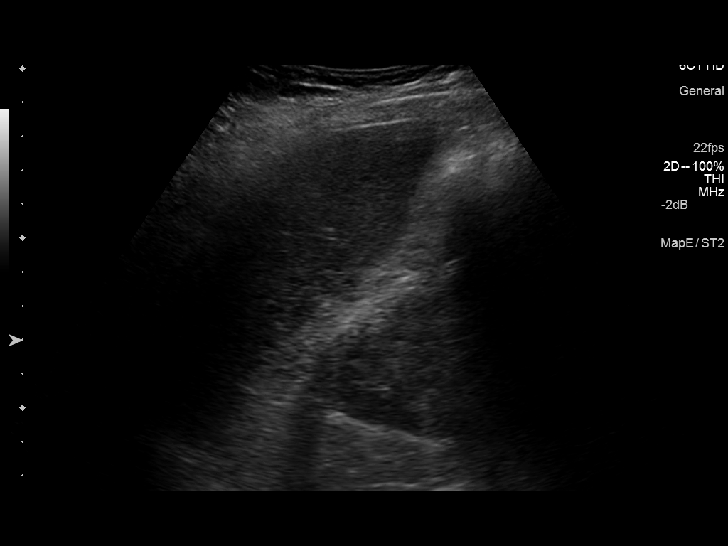
[im 19/42]
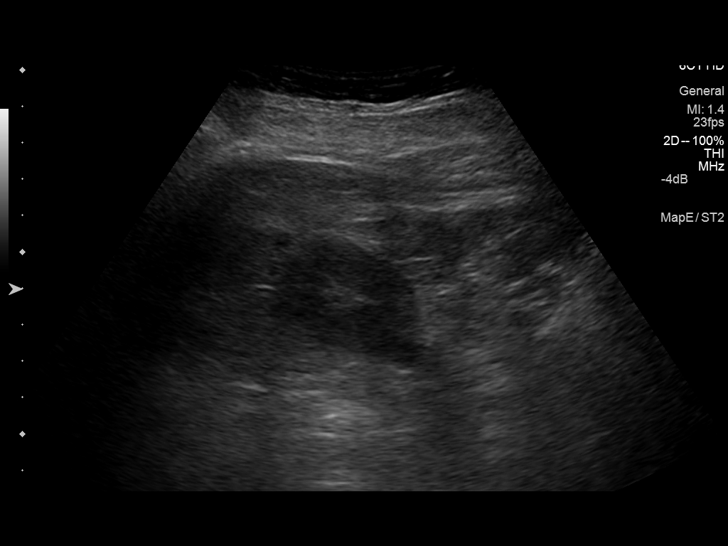
[im 23/42]
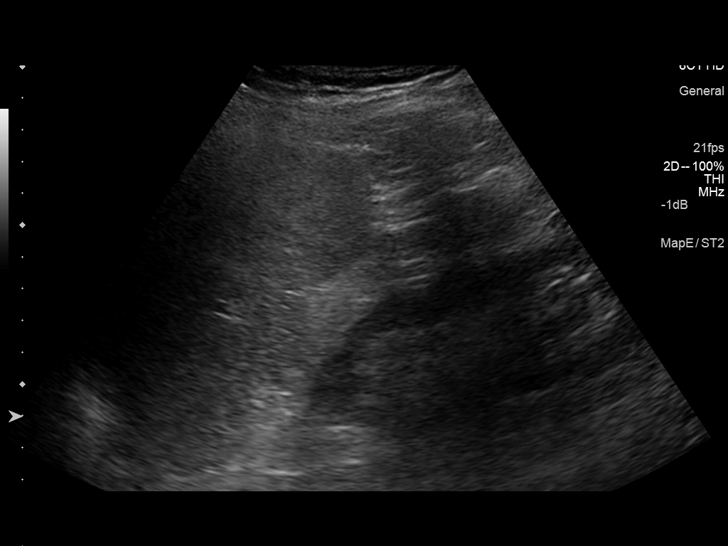
[im 26/42]
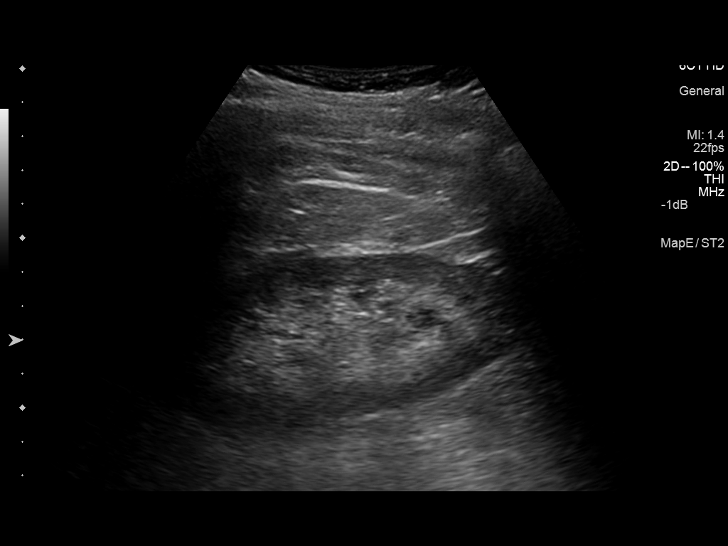
[im 28/42]
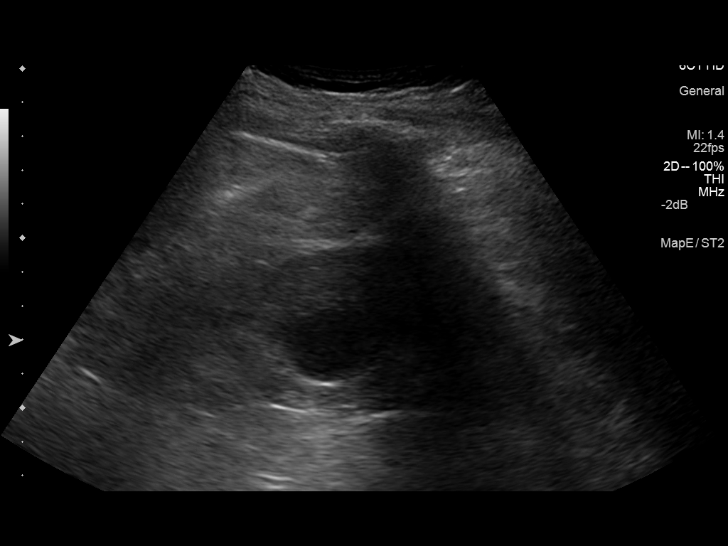
[im 31/42]
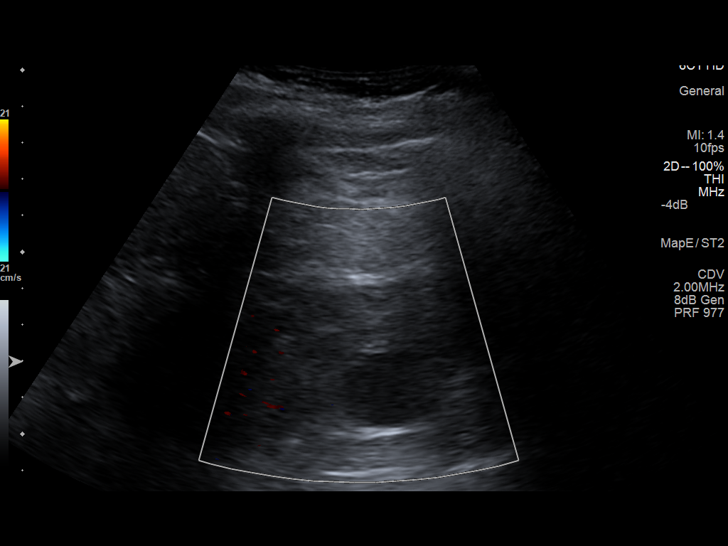
[im 35/42]
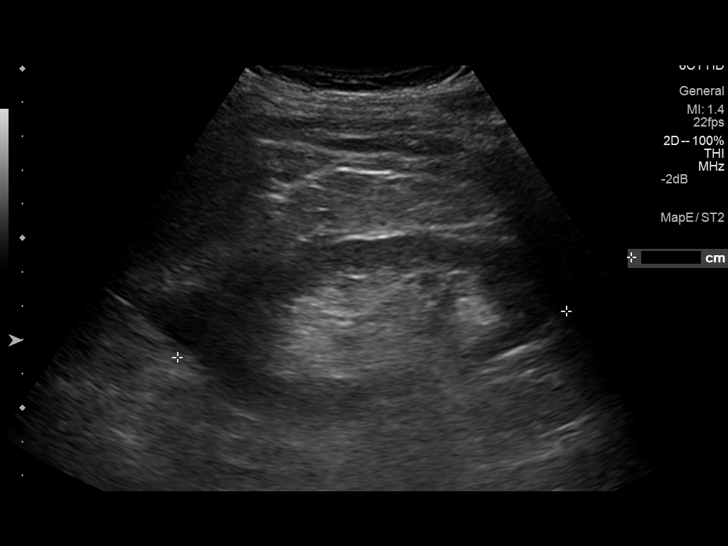
[im 38/42]
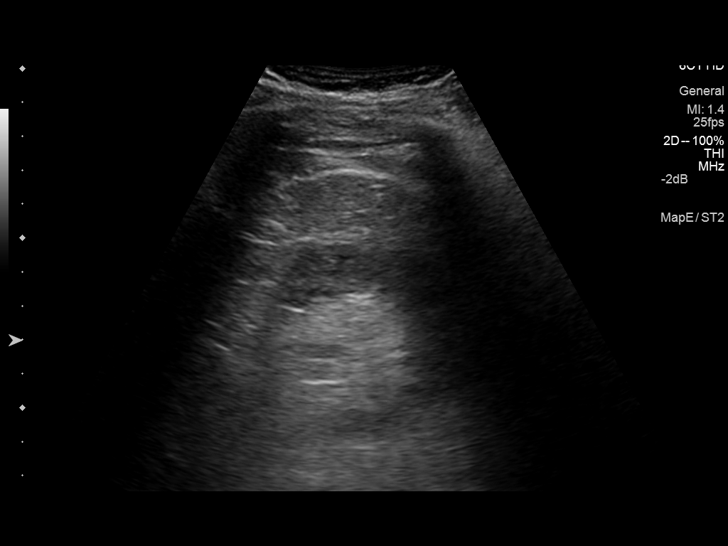
[im 42/42]
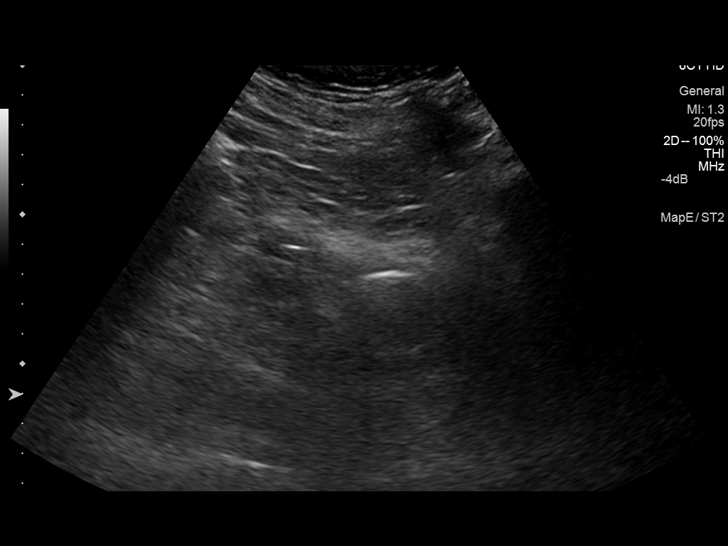

[14 of 25 positions shown; findings below may reference images not displayed]

FINDINGS: Right Kidney:

Length: 11.2 cm. Normal cortical thickness and echogenicity. Two
cysts identified at upper pole measuring 3.5 x 3.1 x 3.7 cm and
x 2.3 x 2.6 cm. Tiny hypoechoic nodule at inferior pole likely an
additional cyst 1.6 x 1.1 x 1.3 cm. No definite solid mass,
hydronephrosis or shadowing calcification.

Left Kidney:

Length: 11.5 cm. Normal cortical thickness and echogenicity. Small
cyst at inferior pole 2.8 x 2.1 x 2.5 cm. Additional cyst at upper
pole 2.3 x 2.3 x 2.8 cm. No definite solid mass, hydronephrosis or
shadowing calcification.

Bladder:

Decompressed by Foley catheter, unable to evaluate.
IMPRESSION: BILATERAL renal cysts corresponding to low-attenuation masses
identified on CT.

No evidence of solid renal mass or hydronephrosis.

## 2015-11-18 IMAGING — CT CT ABD-PELV W/ CM
2 of 4 series · 16 of 46 positions shown, 18 images · non-contrast
Comparison: None.

CLINICAL DATA: Patient has dementia and cannot give a history.
Staff noted that when he palpated his abdomen that he seemed to be
uncomfortable. On my exam he is diffusely tender without peritoneal
signs. He is afebrile at this time.

EXAM:
CT ABDOMEN AND PELVIS WITHOUT CONTRAST
TECHNIQUE: Multidetector CT imaging of the abdomen and pelvis was performed
following the standard protocol without IV contrast.

[Series 2: abd/pel w/o · axial · non-contrast · 0.74mm/px · z∈[+1044,+1494]mm · 13 of 98 slices shown, 15 images]
[im 4/98  soft-tissue]
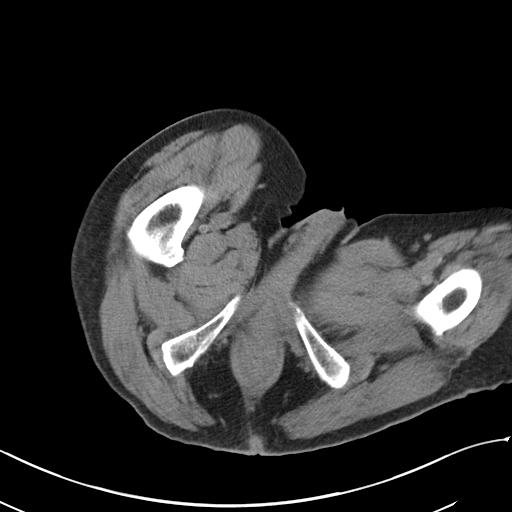
[im 4/98  bone]
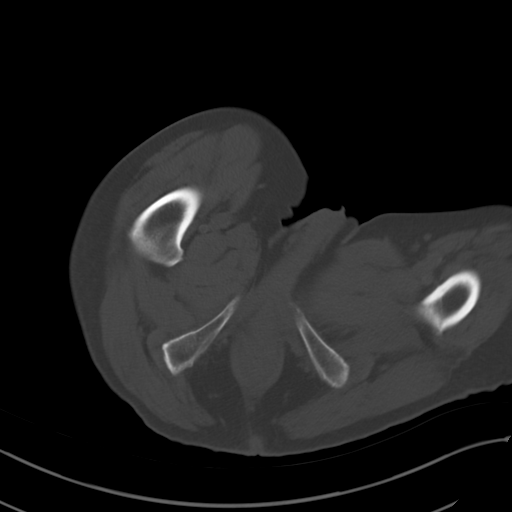
[im 12/98  soft-tissue]
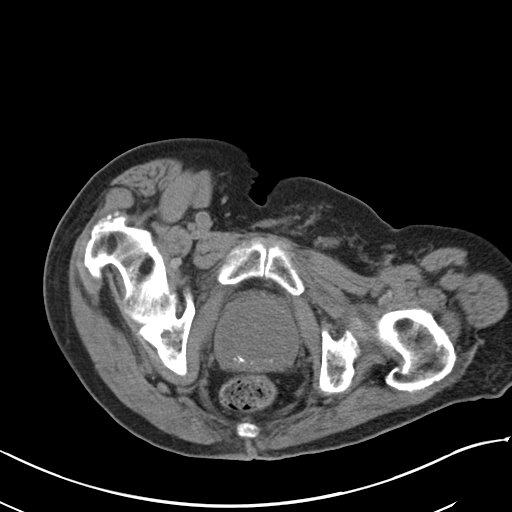
[im 20/98  soft-tissue]
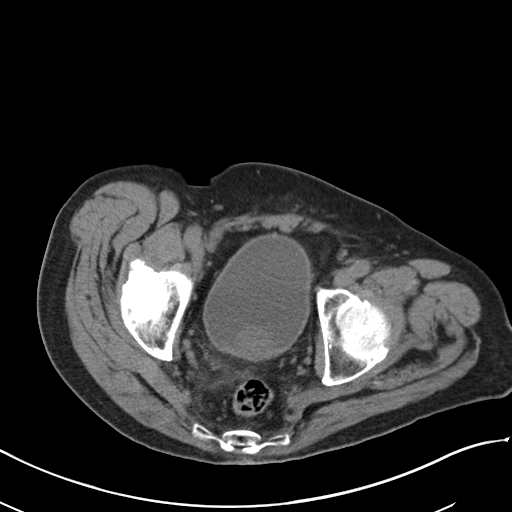
[im 28/98  soft-tissue]
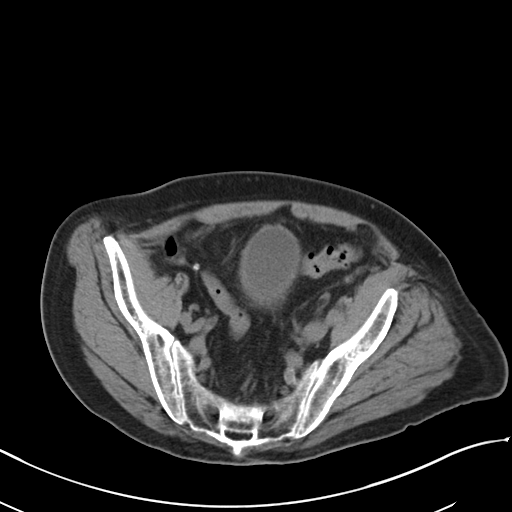
[im 35/98  soft-tissue]
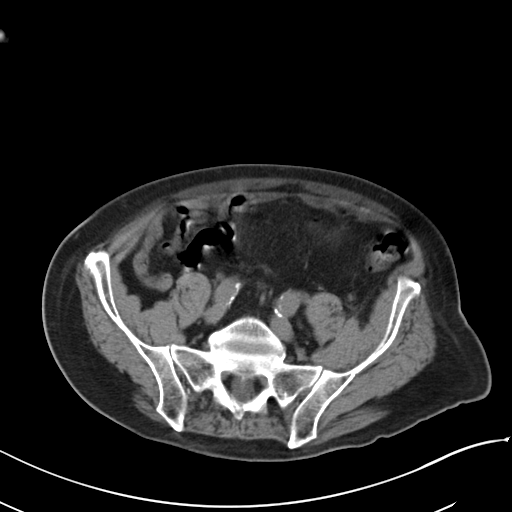
[im 43/98  soft-tissue]
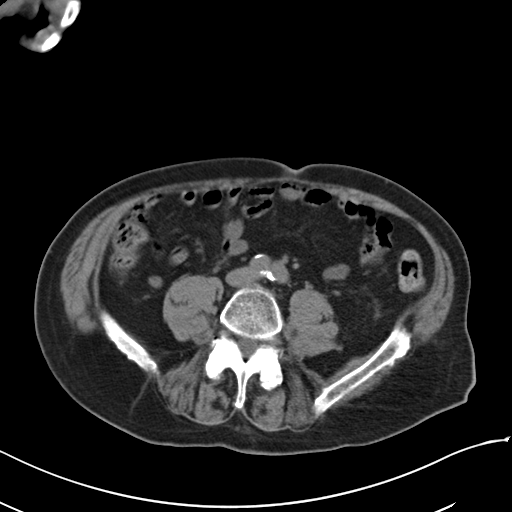
[im 51/98  soft-tissue]
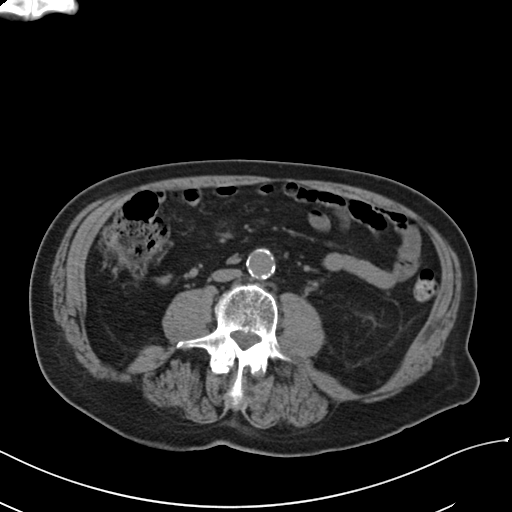
[im 55/98  soft-tissue]
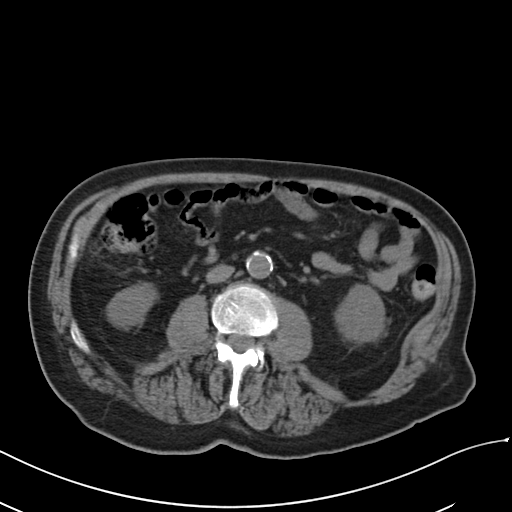
[im 63/98  soft-tissue]
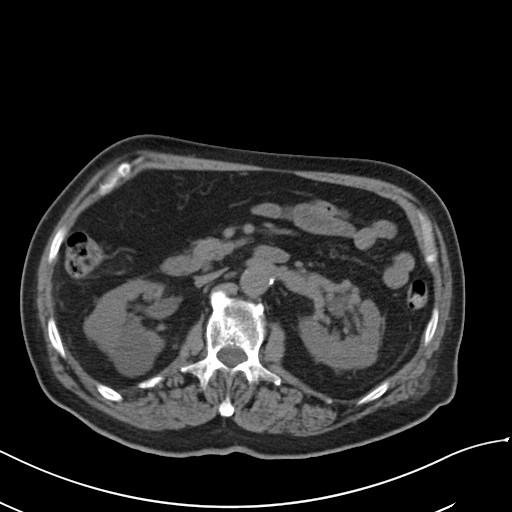
[im 63/98  bone]
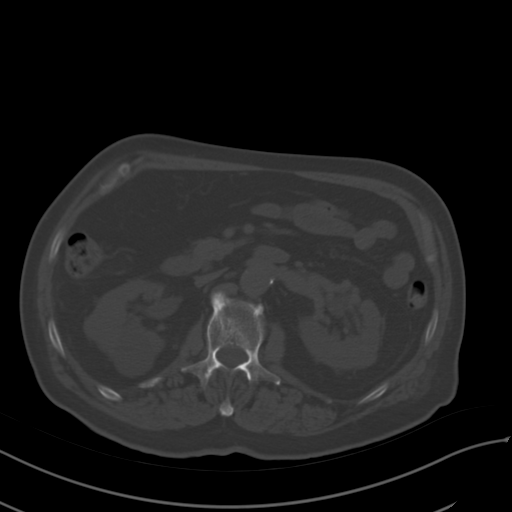
[im 70/98  soft-tissue]
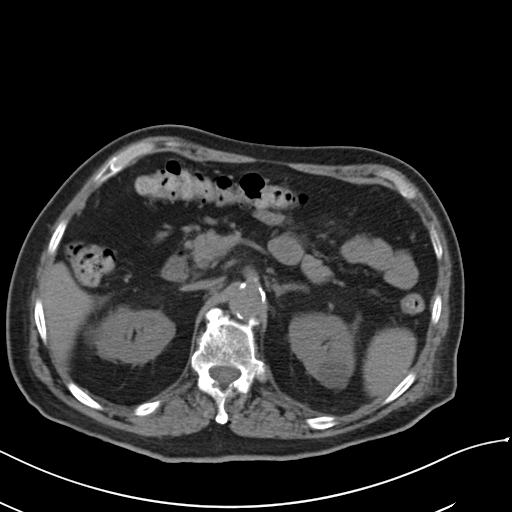
[im 78/98  soft-tissue]
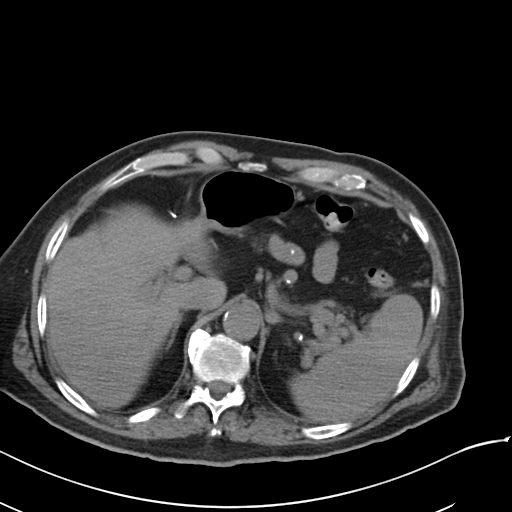
[im 86/98  soft-tissue]
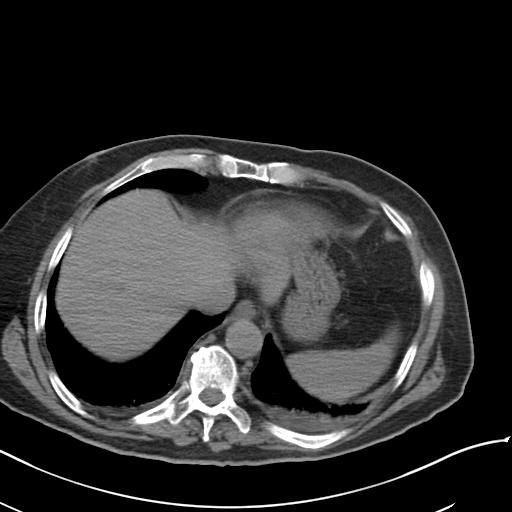
[im 94/98  soft-tissue]
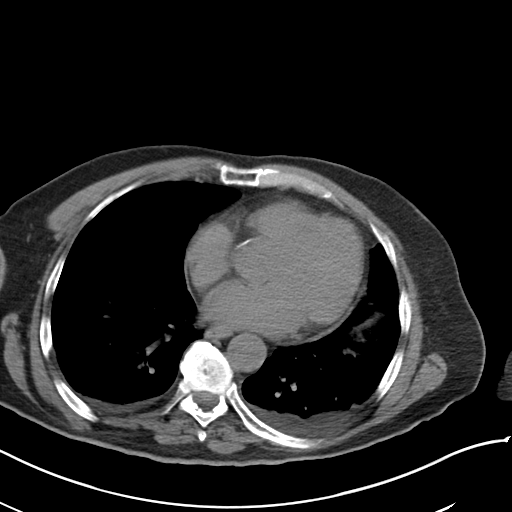

[Series 5: coronal · coronal · 0.74mm/px · 3 of 82 slices shown]
[im 28/82  soft-tissue]
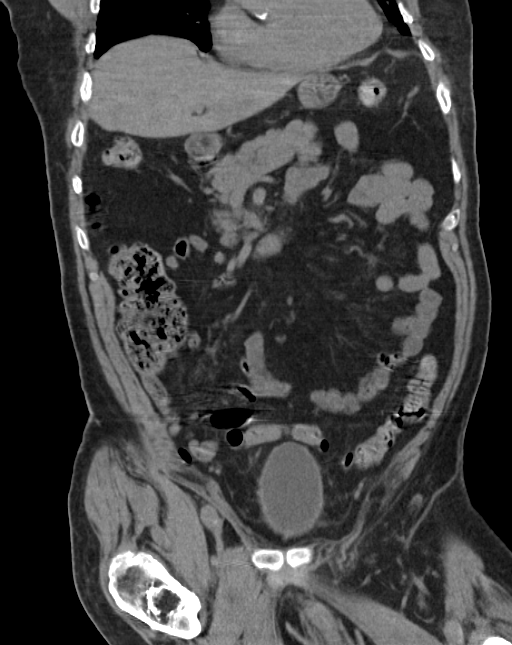
[im 37/82  soft-tissue]
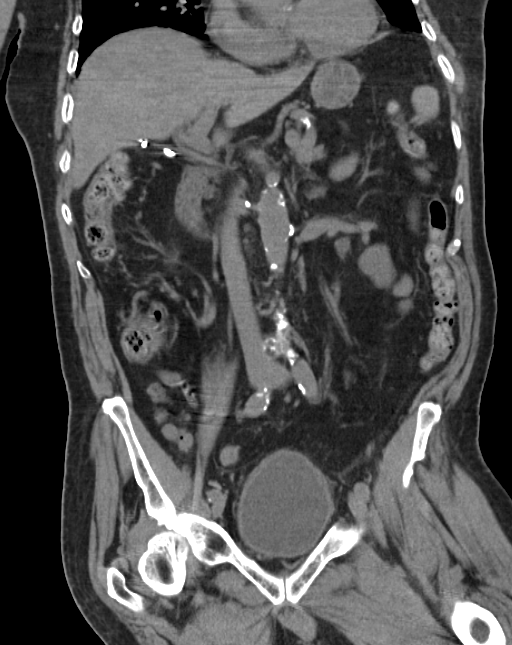
[im 46/82  soft-tissue]
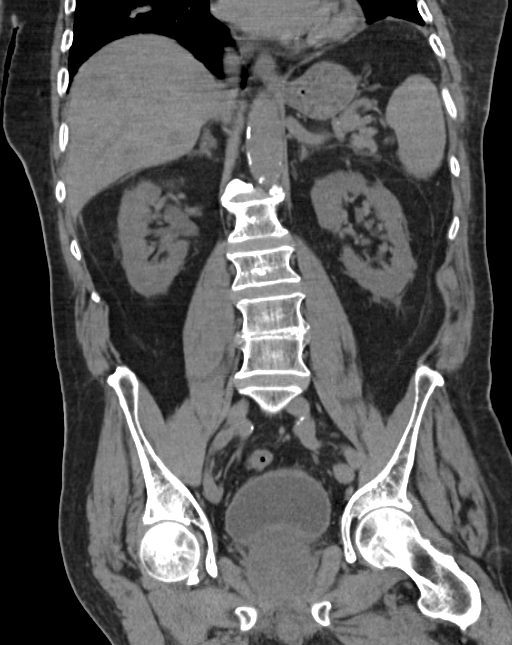

[16 of 46 positions shown; findings below may reference images not displayed]

FINDINGS: Bilateral small pleural effusions.

No renal, ureteral, or bladder calculi. No obstructive uropathy. No
perinephric stranding is seen. There are bilateral hypodense, fluid
attenuating renal mass is most consistent with cysts. The largest
right renal interpolar mass measures 4.7 x 3.6 cm. The largest left
renal upper pole mass measures 2.5 x 2.6 cm. The bladder is
unremarkable. The prostate gland is enlarged measuring 6.1 x 6.5 cm.

The liver demonstrates no focal abnormality. The gallbladder is
surgically absent.. The spleen demonstrates no focal abnormality.
The adrenal glands and pancreas are normal.

The unopacified stomach, duodenum, small intestine and large
intestine are unremarkable, but evaluation is limited by lack of
oral contrast. There is no pneumoperitoneum, pneumatosis, or portal
venous gas. There is no abdominal or pelvic free fluid. There is no
lymphadenopathy.

The abdominal aorta is normal in caliber with atherosclerosis.

There is degenerative disc disease most severe at L3-4. There is 2
mm of anterolisthesis of L4 on L5 secondary to facet arthropathy.
There is severe facet arthropathy at L1-2, L2-3, L3-4, L4-5 and
L5-S1.
IMPRESSION: 1. No acute abdominal or pelvic pathology.
2. No nephrolithiasis.
3. Bilateral renal cysts.
4. Lumbar spine spondylosis.
5. Prostatomegaly.  Correlate with PSA.
